# Patient Record
Sex: Female | Born: 1975 | Race: Asian | Hispanic: No | Marital: Married | Smoking: Never smoker
Health system: Southern US, Community
[De-identification: ages and names within clinical notes are randomized; demographics above are authoritative.]

## PROBLEM LIST (undated history)

## (undated) DIAGNOSIS — R519 Headache, unspecified: Secondary | ICD-10-CM

## (undated) DIAGNOSIS — R51 Headache: Secondary | ICD-10-CM

## (undated) HISTORY — DX: Headache: R51

## (undated) HISTORY — DX: Headache, unspecified: R51.9

## (undated) HISTORY — PX: TYMPANOPLASTY: SHX33

---

## 2009-03-20 ENCOUNTER — Emergency Department (HOSPITAL_COMMUNITY): Admission: EM | Admit: 2009-03-20 | Discharge: 2009-03-20 | Payer: Self-pay | Admitting: Emergency Medicine

## 2011-03-20 IMAGING — CR DG FINGER RING 2+V*L*
1 series · 1 of 1 positions shown · non-contrast
Comparison: None

CLINICAL DATA: Crush injury.  Pain.

LEFT RING FINGER 2+V

[view not recorded]
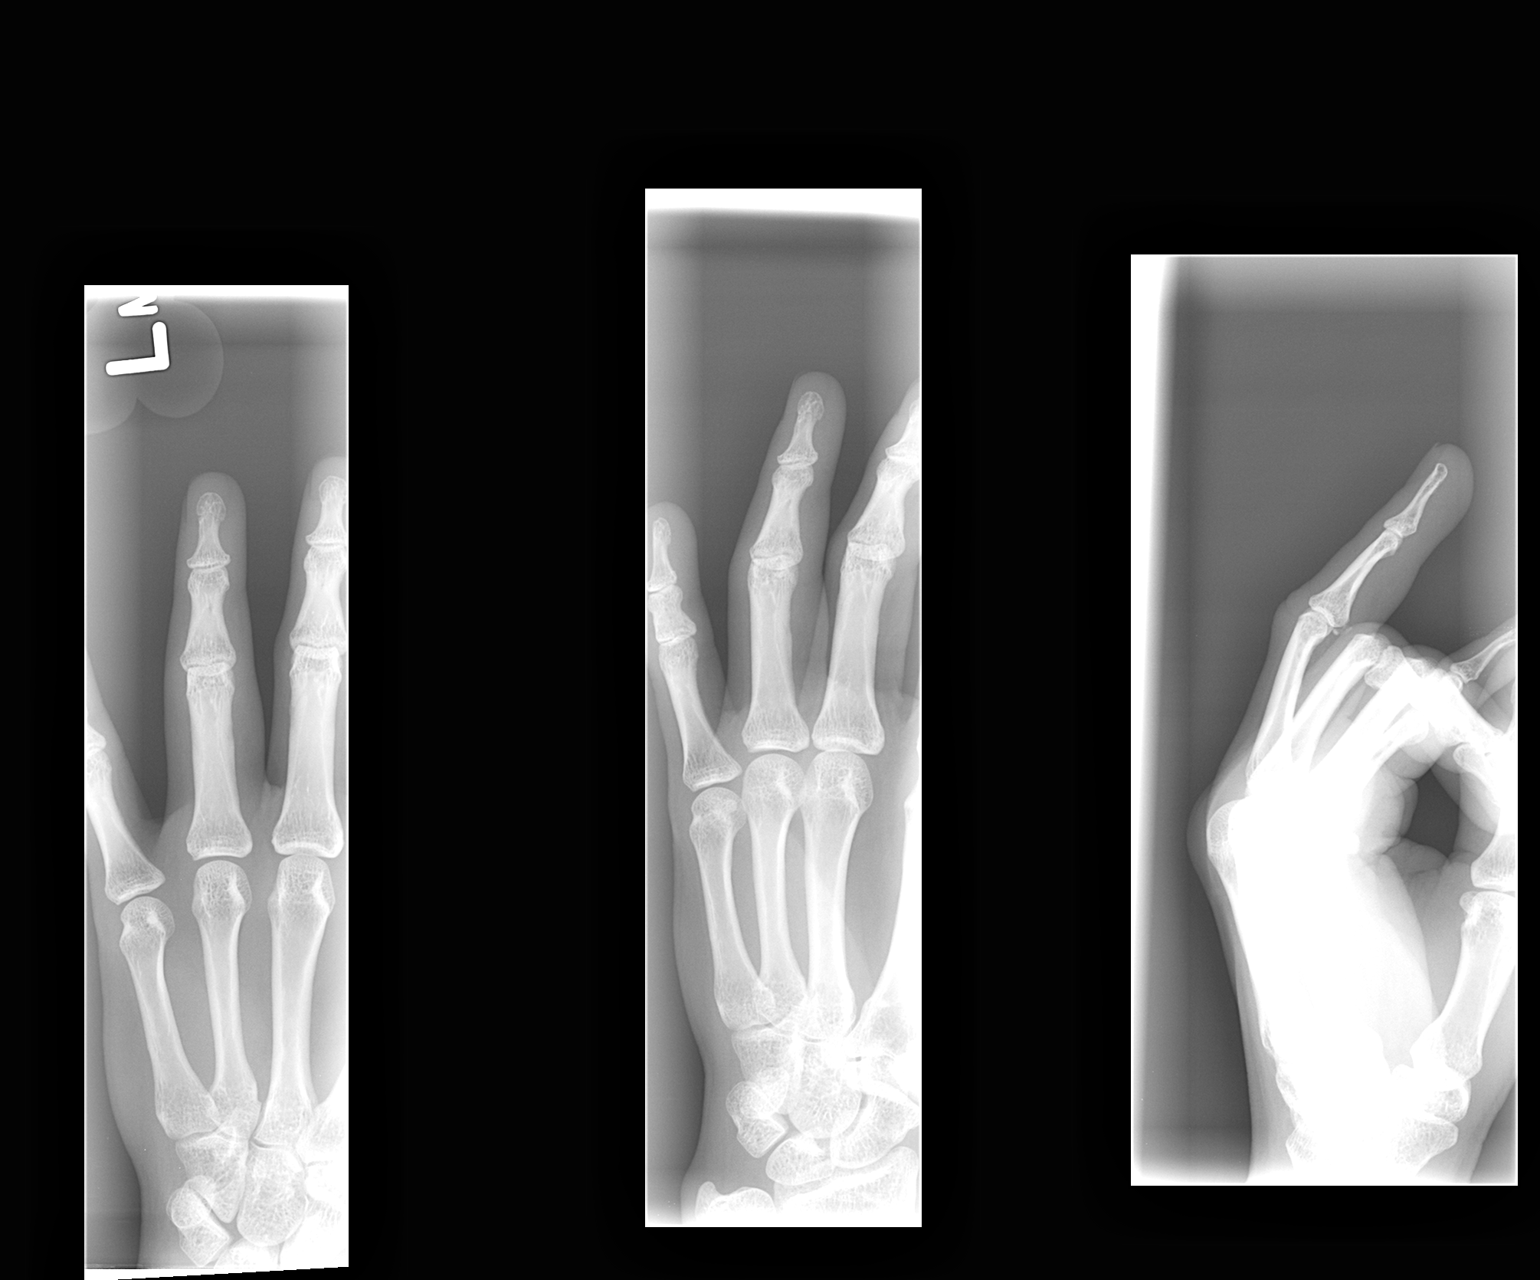

[1 of 1 positions shown; findings below may reference images not displayed]

FINDINGS: There is a fracture of the proximal volar corner of the
proximal phalanx of the ring finger suggesting a avulsion of the
flexor tendon.  Articular surfaces appear intact.  No other
regional abnormality.
IMPRESSION: A avulsion fracture of the proximal volar corner of the middle
phalanx of the ring finger.

## 2012-10-20 DIAGNOSIS — R109 Unspecified abdominal pain: Secondary | ICD-10-CM | POA: Insufficient documentation

## 2012-10-20 DIAGNOSIS — N83209 Unspecified ovarian cyst, unspecified side: Secondary | ICD-10-CM | POA: Insufficient documentation

## 2013-06-10 DIAGNOSIS — N760 Acute vaginitis: Secondary | ICD-10-CM | POA: Insufficient documentation

## 2013-08-31 DIAGNOSIS — J0100 Acute maxillary sinusitis, unspecified: Secondary | ICD-10-CM | POA: Insufficient documentation

## 2013-10-23 DIAGNOSIS — T50905A Adverse effect of unspecified drugs, medicaments and biological substances, initial encounter: Secondary | ICD-10-CM | POA: Insufficient documentation

## 2013-12-15 ENCOUNTER — Ambulatory Visit: Admitting: Neurology

## 2013-12-18 ENCOUNTER — Encounter (INDEPENDENT_AMBULATORY_CARE_PROVIDER_SITE_OTHER): Payer: Self-pay

## 2013-12-18 ENCOUNTER — Encounter: Payer: Self-pay | Admitting: Neurology

## 2013-12-18 ENCOUNTER — Ambulatory Visit (INDEPENDENT_AMBULATORY_CARE_PROVIDER_SITE_OTHER): Admitting: Neurology

## 2013-12-18 VITALS — BP 126/86 | HR 83 | Ht 59.0 in | Wt 114.0 lb

## 2013-12-18 DIAGNOSIS — R519 Headache, unspecified: Secondary | ICD-10-CM | POA: Insufficient documentation

## 2013-12-18 DIAGNOSIS — G43909 Migraine, unspecified, not intractable, without status migrainosus: Secondary | ICD-10-CM | POA: Insufficient documentation

## 2013-12-18 DIAGNOSIS — G43009 Migraine without aura, not intractable, without status migrainosus: Secondary | ICD-10-CM

## 2013-12-18 DIAGNOSIS — R51 Headache: Secondary | ICD-10-CM

## 2013-12-18 MED ORDER — RIZATRIPTAN BENZOATE 10 MG PO TABS: 10.0000 mg | ORAL_TABLET | ORAL | Status: DC | PRN

## 2013-12-18 MED ORDER — NORTRIPTYLINE HCL 10 MG PO CAPS: ORAL_CAPSULE | ORAL | Status: DC

## 2013-12-18 NOTE — Progress Notes (Signed)
PATIENT: Carolyn [name] DOB: 1976-06-05  HISTORICAL  Carolyn [name] is a 38 years old right-handed Asian female, referred by her primary care physician for evaluation of frequent headaches,  She reported a history of migraine headaches since high school, getting worse over the past one year, to almost daily basis, her headache usually triggered by hungry, and Eye-Strain, her typical migraine it is lateralized severe pounding headaches with associated light, noise sensitivity, lasting couple hours, sometimes of days,  Over the past one year, she has self medicated herself with almost daily ibuprofen, naproxen, has developed a rash,  She presented to the emergency room 2 weeks ago following one episode of severe headaches, was given prescription of Maxalt, last night, she tried it for the first time, which has been very helpful, there was no significant side effect  REVIEW OF SYSTEMS: Full 14 system review of systems performed and notable only for palpation, eye pain, feeling hot, flushing, joint pain, cramps, headaches, dizziness  ALLERGIES: Allergies  Allergen Reactions  . Naproxen     rash    HOME MEDICATIONS: No current outpatient prescriptions on file prior to visit.   No current facility-administered medications on file prior to visit.    PAST MEDICAL HISTORY: No past medical history on file.  PAST SURGICAL HISTORY: No past surgical history on file.  FAMILY HISTORY: No family history on file.  SOCIAL HISTORY:  History   Social History  . Marital Status: Married    Spouse Name: N/A    Number of Children: 0  . Years of Education: N/A   Occupational History  . Not on file.   Social History Main Topics  . Smoking status: Not on file  . Smokeless tobacco: Not on file  . Alcohol Use: Not on file  . Drug Use: Not on file  . Sexual Activity: Not on file   Other Topics Concern  . Not on file   Social History Narrative  . No narrative on file    PHYSICAL  EXAM   Filed Vitals:   12/18/13 1044  BP: 126/86  Pulse: 83  Height: 4\' 11"  (1.499 m)  Weight: 114 lb (51.71 kg)    Not recorded    Body mass index is 23.01 kg/(m^2).   Generalized: In no acute distress  Neck: Supple, no carotid bruits   Cardiac: Regular rate rhythm  Pulmonary: Clear to auscultation bilaterally  Musculoskeletal: No deformity  Neurological examination  Mentation: Alert oriented to time, place, history taking, and causual conversation  Cranial nerve II-XII: Pupils were equal round reactive to light. Extraocular movements were full.  Visual field were full on confrontational test. Bilateral fundi were sharp.  Facial sensation and strength were normal. Hearing was intact to finger rubbing bilaterally. Uvula tongue midline.  Head turning and shoulder shrug and were normal and symmetric.Tongue protrusion into cheek strength was normal.  Motor: Normal tone, bulk and strength.  Sensory: Intact to fine touch, pinprick, preserved vibratory sensation, and proprioception at toes.  Coordination: Normal finger to nose, heel-to-shin bilaterally there was no truncal ataxia  Gait: Rising up from seated position without assistance, normal stance, without trunk ataxia, moderate stride, good arm swing, smooth turning, able to perform tiptoe, and heel walking without difficulty.   Romberg signs: Negative  Deep tendon reflexes: Brachioradialis 2/2, biceps 2/2, triceps 2/2, patellar 2/2, Achilles 2/2, plantar responses were flexor bilaterally.   DIAGNOSTIC DATA (LABS, IMAGING, TESTING) - I reviewed patient records, labs, notes, testing and imaging myself where available.  ASSESSMENT AND PLAN  Carolyn [name] is a 38 y.o. female complains of  frequent migraine headaches, essentially normal neurological examinations,  1, stop preventive medications, nortriptyline, 10 mg, titrating to 20 mg every night 2. Maxalt as needed 3. Return to clinic with Eber Jones in 6  months    Levert Feinstein, M.D. Ph.D.  [facility] [address] [place], [place] [address] [phone]

## 2014-01-28 DIAGNOSIS — R319 Hematuria, unspecified: Secondary | ICD-10-CM | POA: Insufficient documentation

## 2014-01-28 DIAGNOSIS — K319 Disease of stomach and duodenum, unspecified: Secondary | ICD-10-CM | POA: Insufficient documentation

## 2014-02-20 DIAGNOSIS — Z3002 Counseling and instruction in natural family planning to avoid pregnancy: Secondary | ICD-10-CM | POA: Insufficient documentation

## 2014-04-26 DIAGNOSIS — F529 Unspecified sexual dysfunction not due to a substance or known physiological condition: Secondary | ICD-10-CM | POA: Insufficient documentation

## 2014-05-03 ENCOUNTER — Telehealth: Payer: Self-pay | Admitting: Neurology

## 2014-05-03 NOTE — Telephone Encounter (Signed)
Left message for patient regarding rescheduling 06/21/14 appointment per Carolyn's schedule, rescheduled to next available with DR. Terrace ArabiaYan on same day.

## 2014-06-21 ENCOUNTER — Ambulatory Visit: Admitting: Nurse Practitioner

## 2014-06-21 ENCOUNTER — Encounter: Payer: Self-pay | Admitting: Neurology

## 2014-06-21 ENCOUNTER — Ambulatory Visit (INDEPENDENT_AMBULATORY_CARE_PROVIDER_SITE_OTHER): Admitting: Neurology

## 2014-06-21 VITALS — BP 126/91 | HR 100 | Ht 59.0 in | Wt 118.0 lb

## 2014-06-21 DIAGNOSIS — G43009 Migraine without aura, not intractable, without status migrainosus: Secondary | ICD-10-CM

## 2014-06-21 MED ORDER — NORTRIPTYLINE HCL 10 MG PO CAPS: ORAL_CAPSULE | ORAL | Status: DC

## 2014-06-21 NOTE — Progress Notes (Signed)
PATIENT: Carolyn [name] DOB: 1975-12-26  HISTORICAL  Carolyn [name] is a 39 years old right-handed Asian female, referred by her primary care physician for evaluation of frequent headaches,  She reported a history of migraine headaches since high school, getting worse over the past one year, to almost daily basis, her headache usually triggered by hungry, and Eye-Strain, her typical migraine it is lateralized severe pounding headaches with associated light, noise sensitivity, lasting couple hours, sometimes of days,  Over the past one year, she has self medicated herself with almost daily ibuprofen, naproxen, has developed a rash,  She presented to the emergency room 2 weeks ago following one episode of severe headaches, was given prescription of Maxalt, last night, she tried it for the first time, which has been very helpful, there was no significant side effect  UPDATE Jan 11th 2016: Her headache has much improved on left taking nortriptyine 20mg  every night, she can sleep better, Maxalt as needed has been very helpful too, she reported 85% improvement, very happy about current medications   REVIEW OF SYSTEMS: Full 14 system review of systems performed and notable only for as above  ALLERGIES: Allergies  Allergen Reactions  . Naproxen     rash    HOME MEDICATIONS: Current Outpatient Prescriptions on File Prior to Visit  Medication Sig Dispense Refill  . nortriptyline (PAMELOR) 10 MG capsule One po qhs xone week, then 2 tabs po qhs 60 capsule 12  . rizatriptan (MAXALT) 10 MG tablet Take 1 tablet (10 mg total) by mouth as needed for migraine. May repeat in 2 hours if needed 15 tablet 12   No current facility-administered medications on file prior to visit.    PAST MEDICAL HISTORY: Past Medical History  Diagnosis Date  . HA (headache)     PAST SURGICAL HISTORY: Past Surgical History  Procedure Laterality Date  . Tympanoplasty Left     FAMILY HISTORY: No family  history on file.  SOCIAL HISTORY:  History   Social History  . Marital Status: Married    Spouse Name: N/A    Number of Children: 0  . Years of Education: N/A   Occupational History  . Not on file.   Social History Main Topics  . Smoking status: Not on file  . Smokeless tobacco: Not on file  . Alcohol Use: Not on file  . Drug Use: Not on file  . Sexual Activity: Not on file   Other Topics Concern  . Not on file   Social History Narrative  . No narrative on file    PHYSICAL EXAM   Filed Vitals:   06/21/14 1108  BP: 126/91  Pulse: 100  Height: 4\' 11"  (1.499 m)  Weight: 118 lb (53.524 kg)    Not recorded      Body mass index is 23.82 kg/(m^2).   Generalized: In no acute distress  Neck: Supple, no carotid bruits   Cardiac: Regular rate rhythm  Pulmonary: Clear to auscultation bilaterally  Musculoskeletal: No deformity  Neurological examination  Mentation: Alert oriented to time, place, history taking, and causual conversation  Cranial nerve II-XII: Pupils were equal round reactive to light. Extraocular movements were full.  Visual field were full on confrontational test. Bilateral fundi were sharp.  Facial sensation and strength were normal. Hearing was intact to finger rubbing bilaterally. Uvula tongue midline.  Head turning and shoulder shrug and were normal and symmetric.Tongue protrusion into cheek strength was normal.  Motor: Normal tone, bulk and strength.  Sensory: Intact to fine touch, pinprick, preserved vibratory sensation, and proprioception at toes.  Coordination: Normal finger to nose, heel-to-shin bilaterally there was no truncal ataxia  Gait: Rising up from seated position without assistance, normal stance, without trunk ataxia, moderate stride, good arm swing, smooth turning, able to perform tiptoe, and heel walking without difficulty.   Romberg signs: Negative  Deep tendon reflexes: Brachioradialis 2/2, biceps 2/2, triceps 2/2,  patellar 2/2, Achilles 2/2, plantar responses were flexor bilaterally.   DIAGNOSTIC DATA (LABS, IMAGING, TESTING) - I reviewed patient records, labs, notes, testing and imaging myself where available.   ASSESSMENT AND PLAN  Carolyn [name] is a 39 y.o. female complains of  frequent migraine headaches, essentially normal neurological examinations,  1, keep nortriptyline 20 mg every night 2. Maxalt as needed 3. Return to clinic with Carolyn [name] in 6 to 9 months    Levert Feinstein, M.D. Ph.D.  [facility] [address], [address] [place], [place] [address] [phone]

## 2014-07-24 DIAGNOSIS — N939 Abnormal uterine and vaginal bleeding, unspecified: Secondary | ICD-10-CM | POA: Insufficient documentation

## 2014-11-02 DIAGNOSIS — L720 Epidermal cyst: Secondary | ICD-10-CM | POA: Insufficient documentation

## 2014-12-20 ENCOUNTER — Encounter: Payer: Self-pay | Admitting: Neurology

## 2014-12-20 ENCOUNTER — Ambulatory Visit (INDEPENDENT_AMBULATORY_CARE_PROVIDER_SITE_OTHER): Admitting: Neurology

## 2014-12-20 VITALS — BP 128/86 | HR 96 | Ht 59.0 in | Wt 119.0 lb

## 2014-12-20 DIAGNOSIS — G43009 Migraine without aura, not intractable, without status migrainosus: Secondary | ICD-10-CM | POA: Diagnosis not present

## 2014-12-20 MED ORDER — SUMATRIPTAN SUCCINATE 6 MG/0.5ML ~~LOC~~ SOLN: 6.0000 mg | SUBCUTANEOUS | Status: DC | PRN

## 2014-12-20 MED ORDER — NORTRIPTYLINE HCL 10 MG PO CAPS: ORAL_CAPSULE | ORAL | Status: DC

## 2014-12-20 NOTE — Progress Notes (Signed)
Chief Complaint  Patient presents with  . Migraine    Reports getting migraines at least twice weekly.  Says migraines typically last more than a day and do not always respond to Maxalt.  She discontinued use of nortriptyline because of GI side effects.      PATIENT: Carolyn [name] DOB: [birth year]  HISTORICAL  Carolyn [name] is a 39 years old right-handed Asian female, referred by her primary care physician for evaluation of frequent headaches,  She reported a history of migraine headaches since high school, getting worse over the past one year, to almost daily basis, her headache usually triggered by hungry, and Eye-Strain, her typical migraine it is lateralized severe pounding headaches with associated light, noise sensitivity, lasting couple hours, sometimes of days,  Over the past one year, she has self medicated herself with almost daily ibuprofen, naproxen, has developed a rash,  She presented to the emergency room 2 weeks ago following one episode of severe headaches, was given prescription of Maxalt, last night, she tried it for the first time, which has been very helpful, there was no significant side effect  UPDATE Jan 11th 2016: Her headache has much improved on after nortriptyine  every night, she can sleep better, Maxalt as needed has been very helpful too, she reported 85% improvement, very happy about current medications   UPDATE December 20 2014: She reported that nortriptyline has been very helpful for her migraine headaches, but she is no longer taking it, she complains of mild GI side effect at nighttime, but she also has the habit of eating before going to sleep, she is taking Maxalt as needed, help for most of the time, but sometimes, she has headaches back-to-back, severe nausea, vomiting, repeat dose of Maxalt does not take away the headaches, she continue have almost daily headaches for 2 weeks now,  She is planning on to hysterectomy for fibroids in August 2016, is  currently receiving Depo shots   REVIEW OF SYSTEMS: Full 14 system review of systems performed and notable only for eye pain, headaches  ALLERGIES: Allergies  Allergen Reactions  . Naproxen     rash    HOME MEDICATIONS: Current Outpatient Prescriptions on File Prior to Visit  Medication Sig Dispense Refill  . rizatriptan (MAXALT) 10 MG tablet Take 1 tablet (10 mg total) by mouth as needed for migraine. May repeat in 2 hours if needed 15 tablet 12   No current facility-administered medications on file prior to visit.    PAST MEDICAL HISTORY: Past Medical History  Diagnosis Date  . HA (headache)     PAST SURGICAL HISTORY: Past Surgical History  Procedure Laterality Date  . Tympanoplasty Left     FAMILY HISTORY: No family history on file.  SOCIAL HISTORY:  History   Social History  . Marital Status: Married    Spouse Name: N/A    Number of Children: 0  . Years of Education: N/A   Occupational History  . Not on file.   Social History Main Topics  . Smoking status: Not on file  . Smokeless tobacco: Not on file  . Alcohol Use: Not on file  . Drug Use: Not on file  . Sexual Activity: Not on file   Other Topics Concern  . Not on file   Social History Narrative  . No narrative on file    PHYSICAL EXAM   Filed Vitals:   12/20/14 1113  BP: 128/86  Pulse: 96  Height:  (1.499 m)  Weight:  119 lb (53.978 kg)    Not recorded      Body mass index is 24.02 kg/(m^2).   Generalized: In no acute distress  Neck: Supple, no carotid bruits   Cardiac: Regular rate rhythm  Pulmonary: Clear to auscultation bilaterally  Musculoskeletal: No deformity  Neurological examination  Mentation: Alert oriented to time, place, history taking, and causual conversation  Cranial nerve II-XII: Pupils were equal round reactive to light. Extraocular movements were full.  Visual field were full on confrontational test. Bilateral fundi were sharp.  Facial sensation  and strength were normal. Hearing was intact to finger rubbing bilaterally. Uvula tongue midline.  Head turning and shoulder shrug and were normal and symmetric.Tongue protrusion into cheek strength was normal.  Motor: Normal tone, bulk and strength.  Sensory: Intact to fine touch, pinprick, preserved vibratory sensation, and proprioception at toes.  Coordination: Normal finger to nose, heel-to-shin bilaterally there was no truncal ataxia  Gait: Rising up from seated position without assistance, normal stance, without trunk ataxia, moderate stride, good arm swing, smooth turning, able to perform tiptoe, and heel walking without difficulty.   Romberg signs: Negative  Deep tendon reflexes: Brachioradialis 2/2, biceps 2/2, triceps 2/2, patellar 2/2, Achilles 2/2, plantar responses were flexor bilaterally.  DIAGNOSTIC DATA (LABS, IMAGING, TESTING) - I reviewed patient records, labs, notes, testing and imaging myself where available.   ASSESSMENT AND PLAN  Carolyn [name] is a [age] y.o. female   Worsening Chronic migraine, restart preventive medications nortriptyline titrating to 20 mg every day  Maxalt as needed, Imitrex subcutaneous injection as rescue therapy Return to clinic in 2-3 months with nurse practitioner  Levert FeinsteinYijun Audri Kozub, M.D. Ph.D.  [facility] [address], [address] [place], [place] [id number] [phone]

## 2014-12-20 NOTE — Patient Instructions (Signed)
Magnesium oxide 400 mg twice a day Riboflavin  100 mg twice a day 

## 2014-12-31 DIAGNOSIS — Z3042 Encounter for surveillance of injectable contraceptive: Secondary | ICD-10-CM | POA: Insufficient documentation

## 2015-01-10 ENCOUNTER — Telehealth: Payer: Self-pay | Admitting: Neurology

## 2015-01-10 HISTORY — PX: LAPAROSCOPIC TOTAL HYSTERECTOMY: SUR800

## 2015-01-10 NOTE — Telephone Encounter (Signed)
Spoke to Carolyn [name] at Carolyn [name] [phone] - she is going to allow Carolyn [name] to return the multi-dose vial and she will exchange it for a sumatriptan kit with the same directions.  Spoke to Carolyn [name] - once she picks up the kits (which includes the needle), a nurse at her place of employment will teach her how to inject the medication.  I told her to call back with any questions.

## 2015-01-10 NOTE — Telephone Encounter (Signed)
Patient is calling in regard to Rx sumatriptan 6 mg/0.5 ml solan injection. She went to get this at the pharmacy and they told her she needs to know the needle size. She stated she has never used a needle before and would like to know if it is possible to get a needle similar to what diabetics use, short and easier to inject.  Please call her to discuss.  Thanks!

## 2015-01-11 ENCOUNTER — Telehealth: Payer: Self-pay | Admitting: Neurology

## 2015-01-11 ENCOUNTER — Other Ambulatory Visit: Payer: Self-pay | Admitting: Neurology

## 2015-01-11 ENCOUNTER — Other Ambulatory Visit: Payer: Self-pay | Admitting: *Deleted

## 2015-01-11 NOTE — Telephone Encounter (Signed)
Spoke to pharmacy - she was able to pick up the injection kit today.

## 2015-01-11 NOTE — Telephone Encounter (Signed)
Please call [facility] regarding imitrex

## 2015-02-28 ENCOUNTER — Encounter: Payer: Self-pay | Admitting: Nurse Practitioner

## 2015-02-28 ENCOUNTER — Ambulatory Visit (INDEPENDENT_AMBULATORY_CARE_PROVIDER_SITE_OTHER): Admitting: Nurse Practitioner

## 2015-02-28 VITALS — BP 126/94 | HR 100 | Wt 118.8 lb

## 2015-02-28 DIAGNOSIS — G43009 Migraine without aura, not intractable, without status migrainosus: Secondary | ICD-10-CM | POA: Diagnosis not present

## 2015-02-28 DIAGNOSIS — R519 Headache, unspecified: Secondary | ICD-10-CM

## 2015-02-28 DIAGNOSIS — R51 Headache: Secondary | ICD-10-CM | POA: Diagnosis not present

## 2015-02-28 NOTE — Patient Instructions (Signed)
May stop nortriptyline due to rash, currently taking 10 mg at night Continue Maxalt and Imitrex acutely Call for increase in headaches Follow-up in 6 months

## 2015-02-28 NOTE — Progress Notes (Signed)
[facility]  PATIENT: Carolyn [name] DOB: Apr 24, 1976   REASON FOR VISIT: Follow-up for migraine/headaches HISTORY FROM: Patient    HISTORY OF PRESENT ILLNESS:Carolyn [name] is a 39 years old right-handed Asian female, referred by her primary care physician for evaluation of frequent headaches, She reported a history of migraine headaches since high school, getting worse over the past one year, to almost daily basis, her headache usually triggered by hungry, and Eye-Strain, her typical migraine it is lateralized severe pounding headaches with associated light, noise sensitivity, lasting couple hours, sometimes of days, Over the past one year, she has self medicated herself with almost daily ibuprofen, naproxen, has developed a rash, She presented to the emergency room 2 weeks ago following one episode of severe headaches, was given prescription of Maxalt, last night, she tried it for the first time, which has been very helpful, there was no significant side effect UPDATE December 20 2014: She reported that nortriptyline has been very helpful for her migraine headaches, but she is no longer taking it, she complains of mild GI side effect at nighttime, but she also has the habit of eating before going to sleep, she is taking Maxalt as needed, help for most of the time, but sometimes, she has headaches back-to-back, severe nausea, vomiting, repeat dose of Maxalt does not take away the headaches, she continue have almost daily headaches for 2 weeks now, She is planning on to hysterectomy for fibroids in August 2016, is currently receiving Depo shots   UPDATE 02/28/2015 Ms. Swailes, 39 year old female returns for follow-up. She had a hysterectomy for fibroids in August she is no longer taking her Depo shots. Her headaches are much improved. She is still taking nortriptyline 10 mg at night however she says she has developed a rash from the medication. She takes Maxalt acutely. She has  not had to take the Imitrex injections. She is not aware of any food triggers. She returns for reevaluation   REVIEW OF SYSTEMS: Full 14 system review of systems performed and notable only for those listed, all others are neg:  Constitutional: neg  Cardiovascular: neg Ear/Nose/Throat: neg  Skin: Itching Eyes: Allergies Respiratory: neg Gastroitestinal: neg  Hematology/Lymphatic: neg  Endocrine: neg Musculoskeletal:neg Allergy/Immunology: neg Neurological: neg Psychiatric: neg Sleep : neg   ALLERGIES: Allergies  Allergen Reactions  . Naproxen     rash    HOME MEDICATIONS: Outpatient Prescriptions Prior to Visit  Medication Sig Dispense Refill  . rizatriptan (MAXALT) 10 MG tablet TAKE 1 TABLET BY MOUTH AS NEEDED FOR MIGRAINES; MAY REPEAT IN 2 HOURS IF NEEDED 15 tablet 3  . SUMAtriptan (IMITREX) 6 MG/0.5ML SOLN injection Inject 0.5 mLs (6 mg total) into the skin every 2 (two) hours as needed for migraine or headache. May repeat in 2 hours if headache persists or recurs. 5 mL 6  . nortriptyline (PAMELOR) 10 MG capsule 2 tabs po qhs 60 capsule 11   No facility-administered medications prior to visit.    PAST MEDICAL HISTORY: Past Medical History  Diagnosis Date  . HA (headache)     PAST SURGICAL HISTORY: Past Surgical History  Procedure Laterality Date  . Tympanoplasty Left   . Laparoscopic total hysterectomy  8/ 2016    FAMILY HISTORY: History reviewed. No pertinent family history.  SOCIAL HISTORY: Social History   Social History  . Marital Status: Married    Spouse Name: Ramon Dredge  . Number of Children: o  . Years of Education: college   Occupational History  .  [employer]   Social History Main Topics  . Smoking status: Never Smoker   . Smokeless tobacco: Never Used  . Alcohol Use: No  . Drug Use: No  . Sexual Activity: Not on file   Other Topics Concern  . Not on file   Social History Narrative   Patient lives at home with her  husband.Ramon Dredge). Patient works for [employer] full time.   Education college.   Right handed.   Caffeine None.           PHYSICAL EXAM  Filed Vitals:   02/28/15 1311  BP: 126/94  Pulse: 100  Weight: 118 lb 12.8 oz (53.887 kg)   Body mass index is 23.98 kg/(m^2). Generalized: In no acute distress Neck: Supple, no carotid bruits  Cardiac: Regular rate rhythm Musculoskeletal: No deformity  Neurological examination  Mentation: Alert oriented to time, place, history taking, and causual conversation Cranial nerve II-XII: Pupils were equal round reactive to light. Extraocular movements were full. Visual field were full on confrontational test. Bilateral fundi were sharp. Facial sensation and strength were normal. Hearing was intact to finger rubbing bilaterally. Uvula tongue midline. Head turning and shoulder shrug  were normal and symmetric.Tongue protrusion into cheek strength was normal. Motor: Normal tone, bulk and strength. No focal weakness Sensory: Intact to fine touch, pinprick, preserved vibratory sensation, and proprioception at toes. Coordination: Normal finger to nose, heel-to-shin bilaterally there was no truncal ataxia Gait: Rising up from seated position without assistance, normal stance, without trunk ataxia, moderate stride, good arm swing, smooth turning, able to perform tiptoe, and heel walking without difficulty.  Deep tendon reflexes: Brachioradialis 2/2, biceps 2/2, triceps 2/2, patellar 2/2, Achilles 2/2, plantar responses were flexor bilaterally.   DIAGNOSTIC DATA (LABS, IMAGING, TESTING) - ASSESSMENT AND PLAN  39 y.o. year old female  has a past medical history of HA (headache)/migraine here to follow-up.  May stop nortriptyline due to questionable rash, currently taking 10 mg at night Continue Maxalt and Imitrex acutely I spent 10 additional minutes in total face to face time with the patient more than 50% of which was spent counseling and  coordination of care, reviewing test results reviewing medications and discussing and reviewing the diagnosis of migraine and further treatment options. , Call for increase in headaches, keep a headache diary and record these Given written information on common foods which are migraine triggers , made aware she needs to one time Follow-up in 6 monthsVst time 25 min Nilda Riggs, [facility], [facility], APRN  [facility] [address] [place], [place] [address] [phone]

## 2015-02-28 NOTE — Progress Notes (Signed)
I have reviewed and agreed above plan. 

## 2015-03-21 ENCOUNTER — Ambulatory Visit: Admitting: Nurse Practitioner

## 2015-08-01 ENCOUNTER — Other Ambulatory Visit: Payer: Self-pay | Admitting: *Deleted

## 2015-08-01 MED ORDER — RIZATRIPTAN BENZOATE 10 MG PO TABS: ORAL_TABLET | ORAL | Status: DC

## 2015-08-29 ENCOUNTER — Ambulatory Visit (INDEPENDENT_AMBULATORY_CARE_PROVIDER_SITE_OTHER): Admitting: Nurse Practitioner

## 2015-08-29 ENCOUNTER — Encounter: Payer: Self-pay | Admitting: Nurse Practitioner

## 2015-08-29 VITALS — BP 131/88 | HR 87 | Ht 59.0 in | Wt 121.0 lb

## 2015-08-29 DIAGNOSIS — G43009 Migraine without aura, not intractable, without status migrainosus: Secondary | ICD-10-CM

## 2015-08-29 DIAGNOSIS — R51 Headache: Secondary | ICD-10-CM | POA: Diagnosis not present

## 2015-08-29 DIAGNOSIS — R519 Headache, unspecified: Secondary | ICD-10-CM

## 2015-08-29 NOTE — Patient Instructions (Signed)
Continue Maxalt at current dose does not need refills Sumatritan inject if needed F/U yearly

## 2015-08-29 NOTE — Progress Notes (Signed)
[facility]  PATIENT: Carolyn [name] DOB: Jun 11, 1976   REASON FOR VISIT: Follow-up for migraine headache HISTORY FROM: Patient    HISTORY OF PRESENT ILLNESS: HISTORYMerlita [name] is a 40 years old right-handed Asian female, referred by her primary care physician for evaluation of frequent headaches, She reported a history of migraine headaches since high school, getting worse over the past one year, to almost daily basis, her headache usually triggered by hungry, and Eye-Strain, her typical migraine it is lateralized severe pounding headaches with associated light, noise sensitivity, lasting couple hours, sometimes of days, Over the past one year, she has self medicated herself with almost daily ibuprofen, naproxen, has developed a rash, She presented to the emergency room 2 weeks ago following one episode of severe headaches, was given prescription of Maxalt, last night, she tried it for the first time, which has been very helpful, there was no significant side effect UPDATE December 20 2014: She reported that nortriptyline has been very helpful for her migraine headaches, but she is no longer taking it, she complains of mild GI side effect at nighttime, but she also has the habit of eating before going to sleep, she is taking Maxalt as needed, help for most of the time, but sometimes, she has headaches back-to-back, severe nausea, vomiting, repeat dose of Maxalt does not take away the headaches, she continue have almost daily headaches for 2 weeks now, She is planning on to hysterectomy for fibroids in August 2016, is currently receiving Depo shots   UPDATE 03/20/17[name], 40 year old female returns for follow-up. She had a hysterectomy for fibroids in August she is no longer taking her Depo shots. Her headaches are much improved. She has stopped her  nortriptyline 10 mg at night. She takes Maxalt acutely. She has not had to take the Imitrex injections. She has  prescription glasses . She is watching her  food triggers. She now has a rare migraine relieved with Maxalt. She returns for reevaluation    REVIEW OF SYSTEMS: Full 14 system review of systems performed and notable only for those listed, all others are neg:  Constitutional: neg  Cardiovascular: neg Ear/Nose/Throat: neg  Skin: neg Eyes: neg Respiratory: neg Gastroitestinal: neg  Hematology/Lymphatic: neg  Endocrine: neg Musculoskeletal:neg Allergy/Immunology: neg Neurological: neg Psychiatric: neg Sleep : neg   ALLERGIES: Allergies  Allergen Reactions  . Naproxen     rash    HOME MEDICATIONS: Outpatient Prescriptions Prior to Visit  Medication Sig Dispense Refill  . esomeprazole (NEXIUM) 40 MG capsule Take 40 mg by mouth as needed.     . rizatriptan (MAXALT) 10 MG tablet TAKE 1 TABLET BY MOUTH AS NEEDED FOR MIGRAINES; MAY REPEAT IN 2 HOURS IF NEEDED 15 tablet 11  . SUMAtriptan (IMITREX) 6 MG/0.5ML SOLN injection Inject 0.5 mLs (6 mg total) into the skin every 2 (two) hours as needed for migraine or headache. May repeat in 2 hours if headache persists or recurs. (Patient not taking: Reported on 08/29/2015) 5 mL 6   No facility-administered medications prior to visit.    PAST MEDICAL HISTORY: Past Medical History  Diagnosis Date  . HA (headache)     PAST SURGICAL HISTORY: Past Surgical History  Procedure Laterality Date  . Tympanoplasty Left   . Laparoscopic total hysterectomy  8/ 2016    FAMILY HISTORY: History reviewed. No pertinent family history.  SOCIAL HISTORY: Social History   Social History  . Marital Status: Married    Spouse Name: Ramon Dredge  . Number of  Children: o  . Years of Education: college   Occupational History  .      [employer]   Social History Main Topics  . Smoking status: Never Smoker   . Smokeless tobacco: Never Used  . Alcohol Use: No  . Drug Use: No  . Sexual Activity: Not on file   Other Topics Concern  . Not on file    Social History Narrative   Patient lives at home with her husband.Ramon Dredge(Edward). Patient works for [employer] full time.   Education college.   Right handed.   Caffeine None.           PHYSICAL EXAM  Filed Vitals:   08/29/15 1054  BP: 131/88  Pulse: 87  Height: 4\' 11"  (1.499 m)  Weight: 121 lb (54.885 kg)   Body mass index is 24.43 kg/(m^2). Generalized: In no acute distress Neck: Supple, no carotid bruits  Cardiac: Regular rate rhythm Musculoskeletal: No deformity  Neurological examination  Mentation: Alert oriented to time, place, history taking, and causual conversation Cranial nerve II-XII: Pupils were equal round reactive to light. Extraocular movements were full. Visual field were full on confrontational test. Bilateral fundi were sharp. Facial sensation and strength were normal. Hearing was intact to finger rubbing bilaterally. Uvula tongue midline. Head turning and shoulder shrug were normal and symmetric.Tongue protrusion into cheek strength was normal. Motor: Normal tone, bulk and strength. No focal weakness Sensory: Intact to fine touch, In the upper and lower extremities Coordination: Normal finger to nose, heel-to-shin bilaterally there was no truncal ataxia Gait: Rising up from seated position without assistance, normal stance, without trunk ataxia, moderate stride, good arm swing, smooth turning, able to perform tiptoe, and heel walking without difficulty.  Deep tendon reflexes: Brachioradialis 2/2, biceps 2/2, triceps 2/2, patellar 2/2, Achilles 2/2, plantar responses were flexor bilaterally.   DIAGNOSTIC DATA (LABS, IMAGING, TESTING)  ASSESSMENT AND PLAN  40 y.o. year old female  has a past medical history of HA (headache). here to follow-up. After having a hysterectomy last year and getting glasses her headaches are now rare and relieved with Maxalt.  PLAN Continue Maxalt at current dose does not need refills Sumatritan inject if needed F/U  yearly Nilda RiggsNancy Carolyn Martin, [facility] LLCGNP, [facility], APRN  [facility] [address], [address] [place], [place] [address] [phone]

## 2015-08-29 NOTE — Progress Notes (Signed)
I have reviewed and agreed above plan. 

## 2015-10-27 DIAGNOSIS — J40 Bronchitis, not specified as acute or chronic: Secondary | ICD-10-CM | POA: Insufficient documentation

## 2016-01-20 DIAGNOSIS — J039 Acute tonsillitis, unspecified: Secondary | ICD-10-CM | POA: Insufficient documentation

## 2016-01-30 DIAGNOSIS — Z90710 Acquired absence of both cervix and uterus: Secondary | ICD-10-CM | POA: Insufficient documentation

## 2016-08-27 ENCOUNTER — Ambulatory Visit: Admitting: Nurse Practitioner

## 2016-08-28 ENCOUNTER — Other Ambulatory Visit: Payer: Self-pay | Admitting: Neurology

## 2016-09-24 ENCOUNTER — Ambulatory Visit: Admitting: Nurse Practitioner

## 2016-09-24 ENCOUNTER — Telehealth: Payer: Self-pay | Admitting: *Deleted

## 2016-09-24 NOTE — Telephone Encounter (Signed)
LVM informing patient her follow up today was cancelled due to power outage at office. Left number and advised she call back tomorrow to reschedule.

## 2016-09-25 ENCOUNTER — Telehealth: Payer: Self-pay | Admitting: *Deleted

## 2016-09-25 NOTE — Telephone Encounter (Signed)
LVM requesting patient call back to reschedule her one year follow up with Enid Skeens, NP.

## 2016-10-29 ENCOUNTER — Ambulatory Visit: Admitting: Nurse Practitioner

## 2016-11-16 ENCOUNTER — Other Ambulatory Visit: Payer: Self-pay | Admitting: Neurology

## 2016-11-20 ENCOUNTER — Other Ambulatory Visit: Payer: Self-pay

## 2016-11-20 MED ORDER — RIZATRIPTAN BENZOATE 10 MG PO TABS: ORAL_TABLET | ORAL | 0 refills | Status: DC

## 2016-11-30 ENCOUNTER — Encounter: Payer: Self-pay | Admitting: Nurse Practitioner

## 2016-11-30 ENCOUNTER — Ambulatory Visit (INDEPENDENT_AMBULATORY_CARE_PROVIDER_SITE_OTHER): Admitting: Nurse Practitioner

## 2016-11-30 ENCOUNTER — Encounter (INDEPENDENT_AMBULATORY_CARE_PROVIDER_SITE_OTHER): Payer: Self-pay

## 2016-11-30 VITALS — BP 129/85 | HR 86 | Ht 59.0 in | Wt 120.5 lb

## 2016-11-30 DIAGNOSIS — G43009 Migraine without aura, not intractable, without status migrainosus: Secondary | ICD-10-CM | POA: Diagnosis not present

## 2016-11-30 DIAGNOSIS — R51 Headache: Secondary | ICD-10-CM | POA: Diagnosis not present

## 2016-11-30 DIAGNOSIS — R519 Headache, unspecified: Secondary | ICD-10-CM

## 2016-11-30 MED ORDER — RIZATRIPTAN BENZOATE 10 MG PO TABS: ORAL_TABLET | ORAL | 11 refills | Status: DC

## 2016-11-30 NOTE — Progress Notes (Addendum)
[facility]  PATIENT: Carolyn [name] DOB: 1976-02-14   REASON FOR VISIT: Follow-up for migraine headache HISTORY FROM: Patient    HISTORY OF PRESENT ILLNESS: HISTORYMerlita Maisie [name] is a 41 years old right-handed Asian female, referred by her primary care physician for evaluation of frequent headaches, She reported a history of migraine headaches since high school, getting worse over the past one year, to almost daily basis, her headache usually triggered by hungry, and Eye-Strain, her typical migraine it is lateralized severe pounding headaches with associated light, noise sensitivity, lasting couple hours, sometimes of days, Over the past one year, she has self medicated herself with almost daily ibuprofen, naproxen, has developed a rash, She presented to the emergency room 2 weeks ago following one episode of severe headaches, was given prescription of Maxalt, last night, she tried it for the first time, which has been very helpful, there was no significant side effect UPDATE December 20 2014: She reported that nortriptyline has been very helpful for her migraine headaches, but she is no longer taking it, she complains of mild GI side effect at nighttime, but she also has the habit of eating before going to sleep, she is taking Maxalt as needed, help for most of the time, but sometimes, she has headaches back-to-back, severe nausea, vomiting, repeat dose of Maxalt does not take away the headaches, she continue have almost daily headaches for 2 weeks now, She is planning on to hysterectomy for fibroids in August 2016, is currently receiving Depo shots   UPDATE 03/20/17[name], 41 year old female returns for follow-up. She had a hysterectomy for fibroids in August she is no longer taking her Depo shots. Her headaches are much improved. She has stopped her  nortriptyline 10 mg at night. She takes Maxalt acutely. She has not had to take the Imitrex injections. She has  prescription glasses . She is watching her  food triggers. She now has a rare migraine relieved with Maxalt. She returns for reevaluation  UPDATE 06/22/2018CM [name], 41 year old female returns for follow-up with history of migraine headaches. She was on a preventative nortriptyline. Medication. She does not have migraines to warrant a preventative at this time. She remains on Maxalt but complains of drowsiness. She does not want to take sumatriptan injection. Heat  is a big migraine trigger for her and she about tries to avoid her food triggers as well. She returns for reevaluation  REVIEW OF SYSTEMS: Full 14 system review of systems performed and notable only for those listed, all others are neg:  Constitutional: neg  Cardiovascular: neg Ear/Nose/Throat: neg  Skin: neg Eyes: neg Respiratory: neg Gastroitestinal: neg  Hematology/Lymphatic: neg  Endocrine: neg Musculoskeletal:neg Allergy/Immunology: neg Neurological: neg Psychiatric: neg Sleep : neg   ALLERGIES: Allergies  Allergen Reactions  . Naproxen     rash  . Relpax  [Eletriptan Hydrobromide]     HOME MEDICATIONS: Outpatient Medications Prior to Visit  Medication Sig Dispense Refill  . esomeprazole (NEXIUM) 40 MG capsule Take 40 mg by mouth as needed.     Marland Kitchen. LOTEMAX 0.5 % ophthalmic suspension   0  . rizatriptan (MAXALT) 10 MG tablet TAKE 1 TABLET BY MOUTH AS NEEDED FOR MIGRAINES; MAY REPEAT IN 2 HOURS IF NEEDED 15 tablet 0  . SUMAtriptan (IMITREX) 6 MG/0.5ML SOLN injection Inject 0.5 mLs (6 mg total) into the skin every 2 (two) hours as needed for migraine or headache. May repeat in 2 hours if headache persists or recurs. (Patient not taking: Reported on 11/30/2016)  5 mL 6   No facility-administered medications prior to visit.     PAST MEDICAL HISTORY: Past Medical History:  Diagnosis Date  . HA (headache)     PAST SURGICAL HISTORY: Past Surgical History:  Procedure Laterality Date  . LAPAROSCOPIC TOTAL  HYSTERECTOMY  8/ 2016  . TYMPANOPLASTY Left     FAMILY HISTORY: History reviewed. No pertinent family history.  SOCIAL HISTORY: Social History   Social History  . Marital status: Married    Spouse name: Ramon Dredge  . Number of children: o  . Years of education: college   Occupational History  .      [employer]   Social History Main Topics  . Smoking status: Never Smoker  . Smokeless tobacco: Never Used  . Alcohol use No  . Drug use: No  . Sexual activity: Not on file   Other Topics Concern  . Not on file   Social History Narrative   Patient lives at home with her husband.Ramon Dredge). Patient works for [employer] full time.   Education college.   Right handed.   Caffeine None.           PHYSICAL EXAM  Vitals:   11/30/16 1026  BP: 129/85  Pulse: 86  Weight: 120 lb 8 oz (54.7 kg)  Height: 4\' 11"  (1.499 m)   Body mass index is 24.34 kg/m. Generalized: In no acute distress Neck: Supple,  Musculoskeletal: No deformity  Neurological examination  Mentation: Alert oriented to time, place, history taking, and causual conversation Cranial nerve II-XII: Pupils were equal round reactive to light. Extraocular movements were full. Visual field were full on confrontational test. Bilateral fundi were sharp. Facial sensation and strength were normal. Hearing was intact to finger rubbing bilaterally. Uvula tongue midline. Head turning and shoulder shrug were normal and symmetric.Tongue protrusion into cheek strength was normal. Motor: Normal tone, bulk and strength. No focal weakness Sensory: Intact to fine touch, In the upper and lower extremities Coordination: Normal finger to nose, heel-to-shin bilaterally Gait: Rising up from seated position without assistance, normal stance, without trunk ataxia, moderate stride, good arm swing, smooth turning, able to perform tiptoe, and heel walking without difficulty.  Deep tendon reflexes: Symmetric upper and lower plantar  responses were flexor bilaterally.   DIAGNOSTIC DATA (LABS, IMAGING, TESTING)  ASSESSMENT AND PLAN  41 y.o. year old female  has a past medical history of HA (headache). here to follow-up. After having a hysterectomy last year and getting glasses her headaches are now rare and relieved with Maxalt. The Maxalt sometimes causes her to be too drowsy.The patient is a current patient of Dr. Terrace Arabia who is out of the office today . This note is sent to the work in doctor.     PLAN Continue Maxalt  Try 1/2 tab prn will refill If headaches increase keep a record on migraine tracker Stay well hydrated and eat F/U yearly Nilda Riggs, [facility], [facility], APRN  [facility] [address] [place], [place] [address] [phone]  I reviewed the above note and documentation by the Nurse Practitioner and agree with the history, physical exam, assessment and plan as outlined above. I was immediately available for face-to-face consultation. Huston Foley, MD, PhD [facility])

## 2016-11-30 NOTE — Patient Instructions (Signed)
Continue Maxalt at current dose does not need refills If headaches increase keep a record on migraine tracker F/U yearly

## 2017-01-15 ENCOUNTER — Telehealth: Payer: Self-pay | Admitting: *Deleted

## 2017-01-15 NOTE — Telephone Encounter (Signed)
Called and spoke with Eddie from [facility]. Advised we received PA request d/t quantity limit. Patient given 15 tablets on 11/30/16 with 11 refills.   He states patient can receive 9 tablets every 15 days. Pt filled rx on 10/18/16 (6 tablets), 11/20/16 (9 tablets), 12/23/16 (9 tablets)  I called [facility] at [phone] and spoke with Delisha. She states her insurance plan allows for 12 tablets per month. She is due for refill today.   I called [facility] back. Spoke with pharmacist Asher Muir(Jamie). Gave VO per Dr Terrace ArabiaYan to change rx to qty 12 tablet per month, 11 refills. Same directions. She cx prevoius rx sent on 11/30/16.

## 2017-09-19 DIAGNOSIS — R42 Dizziness and giddiness: Secondary | ICD-10-CM | POA: Insufficient documentation

## 2017-09-19 DIAGNOSIS — R110 Nausea: Secondary | ICD-10-CM | POA: Insufficient documentation

## 2017-12-01 NOTE — Progress Notes (Signed)
[facility]  PATIENT: Yvonne Stopher DOB: 1976/04/29   REASON FOR VISIT: Follow-up for migraine headache HISTORY FROM: Patient    HISTORY OF PRESENT ILLNESS: HISTORYMerlita Vandivier is a 42 years old right-handed Asian female, referred by her primary care physician for evaluation of frequent headaches, She reported a history of migraine headaches since high school, getting worse over the past one year, to almost daily basis, her headache usually triggered by hungry, and Eye-Strain, her typical migraine it is lateralized severe pounding headaches with associated light, noise sensitivity, lasting couple hours, sometimes of days, Over the past one year, she has self medicated herself with almost daily ibuprofen, naproxen, has developed a rash, She presented to the emergency room 2 weeks ago following one episode of severe headaches, was given prescription of Maxalt, last night, she tried it for the first time, which has been very helpful, there was no significant side effect UPDATE December 20 2014: She reported that nortriptyline has been very helpful for her migraine headaches, but she is no longer taking it, she complains of mild GI side effect at nighttime, but she also has the habit of eating before going to sleep, she is taking Maxalt as needed, help for most of the time, but sometimes, she has headaches back-to-back, severe nausea, vomiting, repeat dose of Maxalt does not take away the headaches, she continue have almost daily headaches for 2 weeks now, She is planning on to hysterectomy for fibroids in August 2016, is currently receiving Depo shots   UPDATE 03/20/17Ms. Kapusta, 42 year old female returns for follow-up. She had a hysterectomy for fibroids in August she is no longer taking her Depo shots. Her headaches are much improved. She has stopped her  nortriptyline 10 mg at night. She takes Maxalt acutely. She has not had to take the Imitrex injections. She has  prescription glasses . She is watching her  food triggers. She now has a rare migraine relieved with Maxalt. She returns for reevaluation  UPDATE 06/22/2018CM Ms. Bourget, 42 year old female returns for follow-up with history of migraine headaches. She was on a preventative nortriptyline. Medication. She does not have migraines to warrant a preventative at this time. She remains on Maxalt but complains of drowsiness. She does not want to take sumatriptan injection. Heat  is a big migraine trigger for her and she about tries to avoid her food triggers as well. She returns for reevaluation UPDATE 6/24/2019CM Ms. Bessler, 42 year old female returns for follow-up with history of migraine headaches.  She has 1-2 headaches per month which does not warrant a preventive medication.  She has been on nortriptyline in the past.  Heat is a trigger along with certain foods.  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  She returns for reevaluation  REVIEW OF SYSTEMS: Full 14 system review of systems performed and notable only for those listed, all others are neg:  Constitutional: neg  Cardiovascular: neg Ear/Nose/Throat: neg  Skin: neg Eyes: neg Respiratory: neg Gastroitestinal: neg  Hematology/Lymphatic: neg  Endocrine: neg Musculoskeletal:neg Allergy/Immunology: neg Neurological: Migraine headaches in good control Psychiatric: neg Sleep : neg   ALLERGIES: Allergies  Allergen Reactions  . Naproxen     rash  . Relpax  [Eletriptan Hydrobromide]     HOME MEDICATIONS: Outpatient Medications Prior to Visit  Medication Sig Dispense Refill  . esomeprazole (NEXIUM) 40 MG capsule Take 40 mg by mouth as needed.     Marland Kitchen LOTEMAX 0.5 % ophthalmic suspension   0  . rizatriptan (  MAXALT) 10 MG tablet TAKE 1 TABLET BY MOUTH AS NEEDED FOR MIGRAINES; MAY REPEAT IN 2 HOURS IF NEEDED 15 tablet 11   No facility-administered medications prior to visit.     PAST MEDICAL HISTORY: Past Medical  History:  Diagnosis Date  . HA (headache)     PAST SURGICAL HISTORY: Past Surgical History:  Procedure Laterality Date  . LAPAROSCOPIC TOTAL HYSTERECTOMY  8/ 2016  . TYMPANOPLASTY Left     FAMILY HISTORY: History reviewed. No pertinent family history.  SOCIAL HISTORY: Social History   Socioeconomic History  . Marital status: Married    Spouse name: Ramon Dredge  . Number of children: o  . Years of education: college  . Highest education level: Not on file  Occupational History    Comment: [employer]  Social Needs  . Financial resource strain: Not on file  . Food insecurity:    Worry: Not on file    Inability: Not on file  . Transportation needs:    Medical: Not on file    Non-medical: Not on file  Tobacco Use  . Smoking status: Never Smoker  . Smokeless tobacco: Never Used  Substance and Sexual Activity  . Alcohol use: No  . Drug use: No  . Sexual activity: Not on file  Lifestyle  . Physical activity:    Days per week: Not on file    Minutes per session: Not on file  . Stress: Not on file  Relationships  . Social connections:    Talks on phone: Not on file    Gets together: Not on file    Attends religious service: Not on file    Active member of club or organization: Not on file    Attends meetings of clubs or organizations: Not on file    Relationship status: Not on file  . Intimate partner violence:    Fear of current or ex partner: Not on file    Emotionally abused: Not on file    Physically abused: Not on file    Forced sexual activity: Not on file  Other Topics Concern  . Not on file  Social History Narrative   Patient lives at home with her husband.Ramon Dredge). Patient works for [employer] full time.   Education college.   Right handed.   Caffeine None.           PHYSICAL EXAM  Vitals:   12/02/17 0953  BP: 114/74  Pulse: 89  Weight: 115 lb 9.6 oz (52.4 kg)  Height: 4\' 11"  (1.499 m)   Body mass index is 23.35 kg/m. Generalized: In no  acute distress Neck: Supple,  Musculoskeletal: No deformity  Neurological examination  Mentation: Alert oriented to time, place, history taking, and causual conversation Cranial nerve II-XII: Pupils were equal round reactive to light. Extraocular movements were full. Visual field were full on confrontational test. Bilateral fundi were sharp. Facial sensation and strength were normal. Hearing was intact to finger rubbing bilaterally. Uvula tongue midline. Head turning and shoulder shrug were normal and symmetric.Tongue protrusion into cheek strength was normal. Motor: Normal tone, bulk and strength. No focal weakness Sensory: Intact to fine touch, In the upper and lower extremities Coordination: Normal finger to nose, heel-to-shin bilaterally Gait: Rising up from seated position without assistance, normal stance, without trunk ataxia, moderate stride, good arm swing, smooth turning, able to perform tiptoe, and heel walking without difficulty.  Deep tendon reflexes: Symmetric upper and lower plantar responses were flexor bilaterally.   DIAGNOSTIC DATA (LABS,  IMAGING, TESTING)  ASSESSMENT AND PLAN  42 y.o. year old female  has a past medical history of HA (headache). here to follow-up. After having a hysterectomy 2  years and getting glasses her headaches are now rare and relieved with Maxalt.  She continues to have 1-2 headaches per month.   PLAN Continue Maxalt  10mg  tab prn will refill If headaches increase keep a record on migraine tracker, migraine buddy Stay well hydrated and avoid skipping meals Avoid known food triggers F/U yearly Nilda RiggsNancy Carolyn Wilna Pennie, [facility]GNP, [facility], APRN  [facility] [address], [address] [place], [place] [address] [phone]

## 2017-12-02 ENCOUNTER — Encounter: Payer: Self-pay | Admitting: Nurse Practitioner

## 2017-12-02 ENCOUNTER — Ambulatory Visit (INDEPENDENT_AMBULATORY_CARE_PROVIDER_SITE_OTHER): Admitting: Nurse Practitioner

## 2017-12-02 VITALS — BP 114/74 | HR 89 | Ht 59.0 in | Wt 115.6 lb

## 2017-12-02 DIAGNOSIS — G43009 Migraine without aura, not intractable, without status migrainosus: Secondary | ICD-10-CM | POA: Diagnosis not present

## 2017-12-02 MED ORDER — RIZATRIPTAN BENZOATE 10 MG PO TABS: ORAL_TABLET | ORAL | 11 refills | Status: DC

## 2017-12-02 NOTE — Patient Instructions (Signed)
Continue Maxalt  10mg  tab prn will refill If headaches increase keep a record on migraine tracker, migraine buddy Stay well hydrated and eat Avoid food triggers F/U yearly

## 2017-12-09 NOTE — Progress Notes (Signed)
I have reviewed and agreed above plan. 

## 2018-02-21 DIAGNOSIS — R232 Flushing: Secondary | ICD-10-CM | POA: Insufficient documentation

## 2018-08-11 ENCOUNTER — Encounter: Payer: Self-pay | Admitting: Nurse Practitioner

## 2018-12-03 ENCOUNTER — Telehealth: Payer: Self-pay | Admitting: *Deleted

## 2018-12-03 NOTE — Telephone Encounter (Signed)
Pt called in and stated something is wrong with her phone she is unable to get the mychart to work she wants to know if she can come in office for her visit

## 2018-12-03 NOTE — Telephone Encounter (Signed)
I spoke to pt and she will come in for office visit, gave her instructions relating to covid 19 process.  She verbalized understanding.

## 2018-12-03 NOTE — Telephone Encounter (Signed)
Due to current COVID 19 pandemic, our office is severely reducing in office visits until further notice, in order to minimize the risk to our patients and healthcare providers.  Pt understands that although there may be some limitations with this type of visit, we will take all precautions to reduce any security or privacy concerns.  Pt understands that this will be treated like an in office visit and we will file with pt's insurance, and there may be a patient responsible charge related to this service.. Consented to Brandon.  Email sent [email or web address]

## 2018-12-04 ENCOUNTER — Ambulatory Visit (INDEPENDENT_AMBULATORY_CARE_PROVIDER_SITE_OTHER): Admitting: Neurology

## 2018-12-04 ENCOUNTER — Other Ambulatory Visit: Payer: Self-pay

## 2018-12-04 ENCOUNTER — Encounter: Payer: Self-pay | Admitting: Neurology

## 2018-12-04 ENCOUNTER — Ambulatory Visit: Admitting: Nurse Practitioner

## 2018-12-04 VITALS — BP 120/80 | HR 87 | Temp 97.8°F | Ht 59.0 in | Wt 121.6 lb

## 2018-12-04 DIAGNOSIS — G43009 Migraine without aura, not intractable, without status migrainosus: Secondary | ICD-10-CM | POA: Diagnosis not present

## 2018-12-04 MED ORDER — VENLAFAXINE HCL ER 37.5 MG PO CP24: ORAL_CAPSULE | ORAL | 3 refills | Status: DC

## 2018-12-04 MED ORDER — RIZATRIPTAN BENZOATE 10 MG PO TABS: ORAL_TABLET | ORAL | 11 refills | Status: DC

## 2018-12-04 MED ORDER — ONDANSETRON 4 MG PO TBDP: 4.0000 mg | ORAL_TABLET | Freq: Three times a day (TID) | ORAL | 1 refills | Status: DC | PRN

## 2018-12-04 NOTE — Progress Notes (Signed)
I have reviewed and agreed above plan. 

## 2018-12-04 NOTE — Progress Notes (Signed)
PATIENT: Carolyn [name] DOB: 1976-04-28  REASON FOR VISIT: follow up HISTORY FROM: patient  HISTORY OF PRESENT ILLNESS: Today 12/04/18  HISTORY  HISTORYMerlita Maisie [name] is a 43 years old right-handed Asian [name], referred by her primary care physician for evaluation of frequent headaches, She reported a history of migraine headaches since high school, getting worse over the past one year, to almost daily basis, her headache usually triggered by hungry, and Eye-Strain, her typical migraine it is lateralized severe pounding headaches with associated light, noise sensitivity, lasting couple hours, sometimes of days, Over the past one year, she has self medicated herself with almost daily ibuprofen, naproxen, has developed a rash, She presented to the emergency room 2 weeks ago following one episode of severe headaches, was given prescription of Maxalt, last night, she tried it for the first time, which has been very helpful, there was no significant side effect UPDATE December 20 2014: She reported that nortriptyline has been very helpful for her migraine headaches, but she is no longer taking it, she complains of mild GI side effect at nighttime, but she also has the habit of eating before going to sleep, she is taking Maxalt as needed, help for most of the time, but sometimes, she has headaches back-to-back, severe nausea, vomiting, repeat dose of Maxalt does not take away the headaches, she continue have almost daily headaches for 2 weeks now, She is planning on to hysterectomy for fibroids in August 2016, is currently receiving Depo shots   UPDATE 03/20/17[name], 43 year old [name] returns for follow-up. She had a hysterectomy for fibroids in August she is no longer taking her Depo shots. Her headaches are much improved. She has stopped her  nortriptyline 10 mg at night. She takes Maxalt acutely. She has not had to take the Imitrex injections. She has prescription glasses . She is watching  her  food triggers. She now has a rare migraine relieved with Maxalt. She returns for reevaluation  UPDATE 06/22/2018[name], 43 year old [name] returns for follow-up with history of migraine headaches. She was on a preventative nortriptyline. Medication. She does not have migraines to warrant a preventative at this time. She remains on Maxalt but complains of drowsiness. She does not want to take sumatriptan injection. Heat  is a big migraine trigger for her and she about tries to avoid her food triggers as well. She returns for reevaluation UPDATE 6/24/2019[name], 43 year old [name] returns for follow-up with history of migraine headaches.  She has 1-2 headaches per month which does not warrant a preventive medication.  She has been on nortriptyline in the past.  Heat is a trigger along with certain foods.  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  She returns for reevaluation  Update December 04, 2018 [name], history of migraine headaches.  She is not currently on preventative medication. For the past several months, she has had almost daily headache, attributes this to stress at work.  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  She describes her typical headache as left frontal, retro-orbital, photophobia, nausea/vomiting, sensitive to smells.  In the past she tried nortriptyline, but had side effect.  She describes this as her typical migraine.  She has been having daily Maxalt.   She had MRI of the brain in June 2019 at [facility] (found in Care Everywhere).  No acute intracranial abnormality, minimal ethmoid sinus disease   REVIEW OF SYSTEMS: Out of a complete 14 system  review of symptoms, the patient complains only of the following symptoms, and all other reviewed systems are negative.  Headache  ALLERGIES: Allergies  Allergen Reactions  . Naproxen     rash  . Relpax  [Eletriptan Hydrobromide]     HOME  MEDICATIONS: Outpatient Medications Prior to Visit  Medication Sig Dispense Refill  . esomeprazole (NEXIUM) 40 MG capsule Take 40 mg by mouth as needed.     Marland Kitchen LOTEMAX 0.5 % ophthalmic suspension   0  . rizatriptan (MAXALT) 10 MG tablet TAKE 1 TABLET BY MOUTH AS NEEDED FOR MIGRAINES; MAY REPEAT IN 2 HOURS IF NEEDED 12 tablet 11   No facility-administered medications prior to visit.     PAST MEDICAL HISTORY: Past Medical History:  Diagnosis Date  . HA (headache)     PAST SURGICAL HISTORY: Past Surgical History:  Procedure Laterality Date  . LAPAROSCOPIC TOTAL HYSTERECTOMY  8/ 2016  . TYMPANOPLASTY Left     FAMILY HISTORY: No family history on file.  SOCIAL HISTORY: Social History   Socioeconomic History  . Marital status: Married    Spouse name: Percell Miller  . Number of children: o  . Years of education: college  . Highest education level: Not on file  Occupational History    Comment: [employer]  . Financial resource strain: Not on file  . Food insecurity    Worry: Not on file    Inability: Not on file  . Transportation needs    Medical: Not on file    Non-medical: Not on file  Tobacco Use  . Smoking status: Never Smoker  . Smokeless tobacco: Never Used  Substance and Sexual Activity  . Alcohol use: No  . Drug use: No  . Sexual activity: Not on file  Lifestyle  . Physical activity    Days per week: Not on file    Minutes per session: Not on file  . Stress: Not on file  Relationships  . Social Herbalist on phone: Not on file    Gets together: Not on file    Attends religious service: Not on file    Active member of club or organization: Not on file    Attends meetings of clubs or organizations: Not on file    Relationship status: Not on file  . Intimate partner violence    Fear of current or ex partner: Not on file    Emotionally abused: Not on file    Physically abused: Not on file    Forced sexual activity: Not on file  Other  Topics Concern  . Not on file  Social History Narrative   Patient lives at home with her husband.Percell Miller). Patient works for [employer] full time.   Education college.   Right handed.   Caffeine None.            PHYSICAL EXAM  There were no vitals filed for this visit. There is no height or weight on file to calculate BMI.  Generalized: Well developed, in no acute distress   Neurological examination  Mentation: Alert oriented to time, place, history taking. Follows all commands speech and language fluent Cranial nerve II-XII: Pupils were equal round reactive to light. Extraocular movements were full, visual field were full on confrontational test. Facial sensation and strength were normal. Uvula tongue midline. Head turning and shoulder shrug  were normal and symmetric. Motor: The motor testing reveals 5 over 5 strength of all 4 extremities. Good symmetric motor  tone is noted throughout.  Sensory: Sensory testing is intact to soft touch on all 4 extremities. No evidence of extinction is noted.  Coordination: Cerebellar testing reveals good finger-nose-finger and heel-to-shin bilaterally.  Gait and station: Gait is normal. Tandem gait is normal. Romberg is negative. No drift is seen.  Reflexes: Deep tendon reflexes are symmetric and normal bilaterally.   DIAGNOSTIC DATA (LABS, IMAGING, TESTING) - I reviewed patient records, labs, notes, testing and imaging myself where available.  No results found for: WBC, HGB, HCT, MCV, PLT No results found for: NA, K, CL, CO2, GLUCOSE, BUN, CREATININE, CALCIUM, PROT, ALBUMIN, AST, ALT, ALKPHOS, BILITOT, GFRNONAA, GFRAA No results found for: CHOL, HDL, LDLCALC, LDLDIRECT, TRIG, CHOLHDL No results found for: [id number] No results found for: VITAMINB12 No results found for: TSH    ASSESSMENT AND PLAN 43 y.o. year old [name]  has a past medical history of HA (headache). here with:  1. Migraine   She has had an increase in her headaches,  likely related to stress at her work.  She describes her typical migraine pattern. She reports she is having almost daily headache, having to take frequent Maxalt.  She will start taking Effexor 37.5 mg at bedtime x 1 week, then increase to 75 mg at bedtime.  She will call for dose adjustments if needed.  I will send in a prescription of Zofran for nausea, Maxalt as needed.   I spent 15 minutes with the patient. 50% of this time was spent discussing her plan of care.   Margie EgeSarah Khaliel Morey, AGNP-C, DNP 12/04/2018, 8:24 AM [facility] [address], [address] [place], [place] [id number] [phone]

## 2018-12-16 ENCOUNTER — Telehealth: Payer: Self-pay

## 2018-12-16 MED ORDER — TOPIRAMATE 25 MG PO TABS: ORAL_TABLET | ORAL | 3 refills | Status: DC

## 2018-12-16 NOTE — Telephone Encounter (Signed)
Patient states she took effexor for one week. She is feeling emotional and her jaw hurts and she is itching. She will not take it again. I stated message will be sent to RN.

## 2018-12-16 NOTE — Telephone Encounter (Signed)
Please call the patient. She can stop the Effexor. We will try Topamax. Start taking 25 mg at bedtime x 3 days, then take 2 tablets at bedtime x 3 days, then take 3 tablets. Please discuss side effects. I sent in a new prescription.

## 2018-12-16 NOTE — Addendum Note (Signed)
Addended by: Suzzanne Cloud on: 12/16/2018 12:26 PM   Modules accepted: Orders

## 2018-12-16 NOTE — Telephone Encounter (Signed)
I called pts mobile.  LMVM re: plan. Stop effexor, start topamax titration as listed per SS/NP.  I relayed possible SE, tiredness, drowsiness, tingling hands/feet, weight loss.  She is to call me back for questions.  Medication already call in.

## 2019-03-09 ENCOUNTER — Ambulatory Visit: Admitting: Neurology

## 2019-04-17 DIAGNOSIS — R059 Cough, unspecified: Secondary | ICD-10-CM | POA: Insufficient documentation

## 2019-12-07 ENCOUNTER — Telehealth: Payer: Self-pay | Admitting: Neurology

## 2019-12-07 MED ORDER — RIZATRIPTAN BENZOATE 10 MG PO TABS: ORAL_TABLET | ORAL | 0 refills | Status: DC

## 2019-12-07 NOTE — Addendum Note (Signed)
Addended by: Guy Begin on: 12/07/2019 11:25 AM   Modules accepted: Orders

## 2019-12-07 NOTE — Telephone Encounter (Signed)
Patient called stating she is needing a refill of Rizatriptan sent to [facility] on [address].

## 2019-12-07 NOTE — Telephone Encounter (Signed)
refilled 30 days, relayed needed appt.

## 2020-01-14 ENCOUNTER — Other Ambulatory Visit: Payer: Self-pay | Admitting: Neurology

## 2020-01-14 MED ORDER — RIZATRIPTAN BENZOATE 10 MG PO TABS: ORAL_TABLET | ORAL | 0 refills | Status: DC

## 2020-01-14 NOTE — Telephone Encounter (Signed)
Patient has FU scheduled. Maxalt refilled per previous refill.

## 2020-01-14 NOTE — Telephone Encounter (Signed)
Pt request refill rizatriptan (MAXALT) 10 MG tablet at [facility] [phone].

## 2020-02-18 ENCOUNTER — Telehealth: Payer: Self-pay | Admitting: Neurology

## 2020-02-18 MED ORDER — RIZATRIPTAN BENZOATE 10 MG PO TABS: ORAL_TABLET | ORAL | 2 refills | Status: DC

## 2020-02-18 NOTE — Telephone Encounter (Signed)
Pt is requesting a refill for rizatriptan (MAXALT) 10 MG tablet .  Pharmacy: [facility] [phone]

## 2020-02-18 NOTE — Addendum Note (Signed)
Addended by: Hermenia Fiscal S on: 02/18/2020 11:27 AM   Modules accepted: Orders

## 2020-02-18 NOTE — Telephone Encounter (Signed)
Has appt in 04/2020 with SS/NP.

## 2020-03-10 ENCOUNTER — Telehealth: Payer: Self-pay | Admitting: Neurology

## 2020-03-10 NOTE — Telephone Encounter (Signed)
Received PA request from [facility] for rizatriptan 10mg . PA was started on . Key is [email or web address]. PA was instantly approved 02/09/20 to 03/20/20. Will fax a copy of the determination to pharmacy.

## 2020-04-19 NOTE — Progress Notes (Signed)
PATIENT: Carolyn [name] DOB: [birth year]  REASON FOR VISIT: follow up HISTORY FROM: patient  HISTORY OF PRESENT ILLNESS: Today 04/20/20  HISTORY  HISTORYMerlita [name] is a 44 years old right-handed Asian female, referred by her primary care physician for evaluation of frequent headaches, She reported a history of migraine headaches since high school, getting worse over the past one year, to almost daily basis, her headache usually triggered by hungry, and Eye-Strain, her typical migraine it is lateralized severe pounding headaches with associated light, noise sensitivity, lasting couple hours, sometimes of days, Over the past one year, she has self medicated herself with almost daily ibuprofen, naproxen, has developed a rash, She presented to the emergency room 2 weeks ago following one episode of severe headaches, was given prescription of Maxalt, last night, she tried it for the first time, which has been very helpful, there was no significant side effect UPDATE December 20 2014: She reported that nortriptyline has been very helpful for her migraine headaches, but she is no longer taking it, she complains of mild GI side effect at nighttime, but she also has the habit of eating before going to sleep, she is taking Maxalt as needed, help for most of the time, but sometimes, she has headaches back-to-back, severe nausea, vomiting, repeat dose of Maxalt does not take away the headaches, she continue have almost daily headaches for 2 weeks now, She is planning on to hysterectomy for fibroids in August 2016, is currently receiving Depo shots   UPDATE 03/20/17[name], 44 year old female returns for follow-up. She had a hysterectomy for fibroids in August she is no longer taking her Depo shots. Her headaches are much improved. She has stopped her nortriptyline 10 mg at night. She takes Maxalt acutely. She has not had to take the Imitrex injections. She has prescription glasses . She is watching  her food triggers. She now has a rare migraine relieved with Maxalt. She returns for reevaluation UPDATE 06/22/2018CMMs. [name], 44 year old female returns for follow-up with history of migraine headaches. She was on a preventative nortriptyline. Medication. She does not have migraines to warrant a preventative at this time. She remains on Maxalt but complains of drowsiness. She does not want to take sumatriptan injection. Heat is a big migraine trigger for her and she about tries to avoid her food triggers as well. She returns for reevaluation UPDATE6/24/2019CMMs. Carolyn [name], 44 year old female returns for follow-up with history of migraine headaches. She has 1-2 headaches per month which does not warrant a preventive medication. She has been on nortriptyline in the past. 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 She returns for reevaluation  Update December 04, 2018 Carolyn [name]: 44 year old female, history of migraine headaches.  She is not currently on preventative medication. For the past several months, she has had almost daily headache, attributes this to stress at work.  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  She describes her typical headache as left frontal, retro-orbital, photophobia, nausea/vomiting, sensitive to smells.  In the past she tried nortriptyline, but had side effect.  She describes this as her typical migraine.  She has been having daily Maxalt.   She had MRI of the brain in June 2019 at [facility] (found in Care Everywhere).  No acute intracranial abnormality, minimal ethmoid sinus disease  Update April 20, 2020 Carolyn [name]: At last visit, was placed on Effexor for headache, reported feeling emotional, jaw pain, itching, was stopped.  Switch  to Topamax working up to 75 mg at bedtime, but never started.  Currently reporting 4-5 migraines a week, works in dietary at [employer], foods and smells  (spices) are triggers, has not been eating a good diet to avoid migraines.  Taking 1/2-1 tablet Maxalt 4-5 times a week, or BC powders.  Headaches are bilateral temples, throbbing, photophobia, phonophobia, nausea.  Has not had severe headache associated with vomiting as before.   REVIEW OF SYSTEMS: Out of a complete 14 system review of symptoms, the patient complains only of the following symptoms, and all other reviewed systems are negative.  Headache   ALLERGIES: Allergies  Allergen Reactions  . Naproxen     rash  . Relpax  [Eletriptan Hydrobromide]     HOME MEDICATIONS: Outpatient Medications Prior to Visit  Medication Sig Dispense Refill  . ondansetron (ZOFRAN ODT) 4 MG disintegrating tablet Take 1 tablet (4 mg total) by mouth every 8 (eight) hours as needed for nausea or vomiting. 20 tablet 1  . rizatriptan (MAXALT) 10 MG tablet TAKE 1 TABLET BY MOUTH AS NEEDED FOR MIGRAINES; MAY REPEAT IN 2 HOURS IF NEEDED 12 tablet 2  . topiramate (TOPAMAX) 25 MG tablet Take 1 tablet at bedtime x 3 days, then take 2 tablets x 3 days, then take 3 tablets at bedtime 120 tablet 3   No facility-administered medications prior to visit.    PAST MEDICAL HISTORY: Past Medical History:  Diagnosis Date  . HA (headache)     PAST SURGICAL HISTORY: Past Surgical History:  Procedure Laterality Date  . LAPAROSCOPIC TOTAL HYSTERECTOMY  8/ 2016  . TYMPANOPLASTY Left     FAMILY HISTORY: No family history on file.  SOCIAL HISTORY: Social History   Socioeconomic History  . Marital status: Married    Spouse name: Ramon Dredge  . Number of children: o  . Years of education: college  . Highest education level: Not on file  Occupational History    Comment: [employer]  Tobacco Use  . Smoking status: Never Smoker  . Smokeless tobacco: Never Used  Substance and Sexual Activity  . Alcohol use: No  . Drug use: No  . Sexual activity: Not on file  Other Topics Concern  . Not on file  Social History  Narrative   Patient lives at home with her husband.Ramon Dredge). Patient works for [employer] full time.   Education college.   Right handed.   Caffeine None.         Social Determinants of Health   Financial Resource Strain:   . Difficulty of Paying Living Expenses: Not on file  Food Insecurity:   . Worried About Programme researcher, broadcasting/film/video in the Last Year: Not on file  . Ran Out of Food in the Last Year: Not on file  Transportation Needs:   . Lack of Transportation (Medical): Not on file  . Lack of Transportation (Non-Medical): Not on file  Physical Activity:   . Days of Exercise per Week: Not on file  . Minutes of Exercise per Session: Not on file  Stress:   . Feeling of Stress : Not on file  Social Connections:   . Frequency of Communication with Friends and Family: Not on file  . Frequency of Social Gatherings with Friends and Family: Not on file  . Attends Religious Services: Not on file  . Active Member of Clubs or Organizations: Not on file  . Attends Banker Meetings: Not on file  . Marital Status: Not on  file  Intimate Partner Violence:   . Fear of Current or Ex-Partner: Not on file  . Emotionally Abused: Not on file  . Physically Abused: Not on file  . Sexually Abused: Not on file   PHYSICAL EXAM  Vitals:   04/20/20 0934  BP: 124/82  Pulse: 80  Weight: 114 lb 6.4 oz (51.9 kg)  Height: 4\' 11"  (1.499 m)   Body mass index is 23.11 kg/m.  Generalized: Well developed, in no acute distress   Neurological examination  Mentation: Alert oriented to time, place, history taking. Follows all commands speech and language fluent Cranial nerve II-XII: Pupils were equal round reactive to light. Extraocular movements were full, visual field were full on confrontational test. Facial sensation and strength were normal. Head turning and shoulder shrug  were normal and symmetric. Motor: The motor testing reveals 5 over 5 strength of all 4 extremities. Good symmetric  motor tone is noted throughout.  Sensory: Sensory testing is intact to soft touch on all 4 extremities. No evidence of extinction is noted.  Coordination: Cerebellar testing reveals good finger-nose-finger and heel-to-shin bilaterally.  Gait and station: Gait is normal.   Reflexes: Deep tendon reflexes are symmetric and normal bilaterally.   DIAGNOSTIC DATA (LABS, IMAGING, TESTING) - I reviewed patient records, labs, notes, testing and imaging myself where available.  No results found for: WBC, HGB, HCT, MCV, PLT No results found for: NA, K, CL, CO2, GLUCOSE, BUN, CREATININE, CALCIUM, PROT, ALBUMIN, AST, ALT, ALKPHOS, BILITOT, GFRNONAA, GFRAA No results found for: CHOL, HDL, LDLCALC, LDLDIRECT, TRIG, CHOLHDL No results found for: No results found for: VITAMINB12 No results found for: TSH  ASSESSMENT AND PLAN 44 y.o. year old female  has a past medical history of HA (headache). here with:  1.  Chronic migraine headache -Previously tried and failed nortriptyline, Effexor -Never started Topamax, reinitiate, working up to 75 mg at bedtime for migraine prevention -Likely rebound headache component to frequency, limit only treating severe headaches with Maxalt -If cannot tolerate Topamax, may consider propanolol, or CGRP -Encouraged to avoid migraine triggers as possible -MRI of the brain in June 2019 at [facility] showed no acute intracranial abnormality, minimal ethmoid sinus disease -Follow-up in 4 months or sooner if needed, to ensure improvement in migraines  I spent 20 minutes of face-to-face and non-face-to-face time with patient.  This included previsit chart review, lab review, study review, order entry, electronic health record documentation, patient education.  [facility], AGNP-C, DNP 04/20/2020, 10:06 AM [facility] [address] [place], [place] [phone]

## 2020-04-20 ENCOUNTER — Ambulatory Visit: Admitting: Neurology

## 2020-04-20 ENCOUNTER — Encounter: Payer: Self-pay | Admitting: Neurology

## 2020-04-20 VITALS — BP 124/82 | HR 80 | Ht 59.0 in | Wt 114.4 lb

## 2020-04-20 DIAGNOSIS — G43009 Migraine without aura, not intractable, without status migrainosus: Secondary | ICD-10-CM

## 2020-04-20 MED ORDER — TOPIRAMATE 25 MG PO TABS: ORAL_TABLET | ORAL | 11 refills | Status: DC

## 2020-04-20 MED ORDER — RIZATRIPTAN BENZOATE 10 MG PO TABS: ORAL_TABLET | ORAL | 11 refills | Status: DC

## 2020-04-20 NOTE — Patient Instructions (Addendum)
Start the Topamax taking 1 tablet at bedtime x 3 days, then take 2 tablets at bedtime x 3 days, then take 3 at bedtime, stay at this dose  Continue maxalt for severe headaches, limit to only taking 2-3 times a week  Avoid triggers for migraines  See you back in 4 months   Topiramate tablets What is this medicine? TOPIRAMATE (toe PYRE a mate) is used to treat seizures in adults or children with epilepsy. It is also used for the prevention of migraine headaches. This medicine may be used for other purposes; ask your health care provider or pharmacist if you have questions. COMMON BRAND NAME(S): Topamax, Topiragen What should I tell my health care provider before I take this medicine? They need to know if you have any of these conditions:  bleeding disorders  kidney disease  lung or breathing disease, like asthma  suicidal thoughts, plans, or attempt; a previous suicide attempt by you or a family member  an unusual or allergic reaction to topiramate, other medicines, foods, dyes, or preservatives  pregnant or trying to get pregnant  breast-feeding How should I use this medicine? Take this medicine by mouth with a glass of water. Follow the directions on the prescription label. Do not cut, crush or chew this medicine. Swallow the tablets whole. You can take it with or without food. If it upsets your stomach, take it with food. Take your medicine at regular intervals. Do not take it more often than directed. Do not stop taking except on your doctor's advice. A special MedGuide will be given to you by the pharmacist with each prescription and refill. Be sure to read this information carefully each time. Talk to your pediatrician regarding the use of this medicine in children. While this drug may be prescribed for children as young as 33 years of age for selected conditions, precautions do apply. Overdosage: If you think you have taken too much of this medicine contact a poison control center  or emergency room at once. NOTE: This medicine is only for you. Do not share this medicine with others. What if I miss a dose? If you miss a dose, take it as soon as you can. If your next dose is to be taken in less than 6 hours, then do not take the missed dose. Take the next dose at your regular time. Do not take double or extra doses. What may interact with this medicine? This medicine may interact with the following medications:  acetazolamide  alcohol  antihistamines for allergy, cough, and cold  aspirin and aspirin-like medicines  atropine  birth control pills  certain medicines for anxiety or sleep  certain medicines for bladder problems like oxybutynin, tolterodine  certain medicines for depression like amitriptyline, fluoxetine, sertraline  certain medicines for seizures like carbamazepine, phenobarbital, phenytoin, primidone, valproic acid, zonisamide  certain medicines for stomach problems like dicyclomine, hyoscyamine  certain medicines for travel sickness like scopolamine  certain medicines for Parkinson's disease like benztropine, trihexyphenidyl  certain medicines that treat or prevent blood clots like warfarin, enoxaparin, dalteparin, apixaban, dabigatran, and rivaroxaban  digoxin  general anesthetics like halothane, isoflurane, methoxyflurane, propofol  hydrochlorothiazide  ipratropium  lithium  medicines that relax muscles for surgery  metformin  narcotic medicines for pain  NSAIDs, medicines for pain and inflammation, like ibuprofen or naproxen  phenothiazines like chlorpromazine, mesoridazine, prochlorperazine, thioridazine  pioglitazone This list may not describe all possible interactions. Give your health care provider a list of all the medicines, herbs, non-prescription  drugs, or dietary supplements you use. Also tell them if you smoke, drink alcohol, or use illegal drugs. Some items may interact with your medicine. What should I watch  for while using this medicine? Visit your doctor or health care professional for regular checks on your progress. Tell your health care professional if your symptoms do not start to get better or if they get worse. Do not stop taking except on your health care professional's advice. You may develop a severe reaction. Your health care professional will tell you how much medicine to take. Wear a medical ID bracelet or chain. Carry a card that describes your disease and details of your medicine and dosage times. This medicine can reduce the response of your body to heat or cold. Dress warm in cold weather and stay hydrated in hot weather. If possible, avoid extreme temperatures like saunas, hot tubs, very hot or cold showers, or activities that can cause dehydration such as vigorous exercise. Check with your health care professional if you have severe diarrhea, nausea, and vomiting, or if you sweat a lot. The loss of too much body fluid may make it dangerous for you to take this medicine. You may get drowsy or dizzy. Do not drive, use machinery, or do anything that needs mental alertness until you know how this medicine affects you. Do not stand up or sit up quickly, especially if you are an older patient. This reduces the risk of dizzy or fainting spells. Alcohol may interfere with the effect of this medicine. Avoid alcoholic drinks. Tell your health care professional right away if you have any change in your eyesight. Patients and their families should watch out for new or worsening depression or thoughts of suicide. Also watch out for sudden changes in feelings such as feeling anxious, agitated, panicky, irritable, hostile, aggressive, impulsive, severely restless, overly excited and hyperactive, or not being able to sleep. If this happens, especially at the beginning of treatment or after a change in dose, call your healthcare professional. This medicine may cause serious skin reactions. They can happen  weeks to months after starting the medicine. Contact your health care provider right away if you notice fevers or flu-like symptoms with a rash. The rash may be red or purple and then turn into blisters or peeling of the skin. Or, you might notice a red rash with swelling of the face, lips or lymph nodes in your neck or under your arms. Birth control may not work properly while you are taking this medicine. Talk to your health care professional about using an extra method of birth control. Women should inform their health care professional if they wish to become pregnant or think they might be pregnant. There is a potential for serious side effects and harm to an unborn child. Talk to your health care professional for more information. What side effects may I notice from receiving this medicine? Side effects that you should report to your doctor or health care professional as soon as possible:  allergic reactions like skin rash, itching or hives, swelling of the face, lips, or tongue  blood in the urine  changes in vision  confusion  loss of memory  pain in lower back or side  pain when urinating  redness, blistering, peeling or loosening of the skin, including inside the mouth  signs and symptoms of bleeding such as bloody or black, tarry stools; red or dark brown urine; spitting up blood or brown material that looks like coffee grounds; red  spots on the skin; unusual bruising or bleeding from the eyes, gums, or nose  signs and symptoms of increased acid in the body like breathing fast; fast heartbeat; headache; confusion; unusually weak or tired; nausea, vomiting  suicidal thoughts, mood changes  trouble speaking or understanding  unusual sweating  unusually weak or tired Side effects that usually do not require medical attention (report to your doctor or health care professional if they continue or are bothersome):  dizziness  drowsiness  fever  loss of appetite  nausea,  vomiting  pain, tingling, numbness in the hands or feet  stomach pain  tiredness  upset stomach This list may not describe all possible side effects. Call your doctor for medical advice about side effects. You may report side effects to FDA at 1-800-FDA-1088. Where should I keep my medicine? Keep out of the reach of children. Store at room temperature between 15 and 30 degrees C (59 and 86 degrees F). Throw away any unused medicine after the expiration date. NOTE: This sheet is a summary. It may not cover all possible information. If you have questions about this medicine, talk to your doctor, pharmacist, or health care provider.  2020 Elsevier/Gold Standard (2018-12-25 15:07:20)

## 2020-04-26 ENCOUNTER — Telehealth: Payer: Self-pay | Admitting: Neurology

## 2020-04-26 MED ORDER — ONDANSETRON 4 MG PO TBDP: 4.0000 mg | ORAL_TABLET | Freq: Three times a day (TID) | ORAL | 1 refills | Status: DC | PRN

## 2020-04-26 NOTE — Telephone Encounter (Signed)
Called pt.  She took 25mg  po daily for 3 days no problem, when increased to 50mg  had reaction, anxiety, reddened skin, itchy, she stopped and now back to baseline.  She is hydrating, having some nausea/vomiting zofran ordered.

## 2020-04-26 NOTE — Telephone Encounter (Signed)
See other note. Done.  

## 2020-04-26 NOTE — Telephone Encounter (Signed)
Pt called,  Discontinued taking topiramate (TOPAMAX) 25 MG tablet 2 nights ago. Because makes me feel nausea, arm turns red and have an anxiety. Would like a call from the nurse.

## 2020-04-26 NOTE — Telephone Encounter (Signed)
I called pt and LMVM for her that stop topamax, she already had.  Two potential medication options are propranolol, or CGRP injectable like ajovy, aimovig, emgality.  Pt to call me back.

## 2020-04-26 NOTE — Telephone Encounter (Signed)
She can stay off the Topamax if adverse effect. Give it a few days, if she wants to try something else, could try propranolol if never tried (I see no history of asthma, HR was 80 at last visit) or CGRP (injection).

## 2020-04-26 NOTE — Telephone Encounter (Signed)
Pt request refill ondansetron (ZOFRAN ODT) 4 MG disintegrating tablet at [facility] [phone]

## 2020-04-27 NOTE — Telephone Encounter (Signed)
LMVM for pt to return call.   

## 2020-04-27 NOTE — Telephone Encounter (Signed)
I spoke to pt and she would like to try again taking the topiramate 25mg  po daily and she how she does, not increasing dose but staying on 25mg  po daily.  I relayed that she can try and let know how she does.  She was not interested in taking other options at this time.  I would relay to SS/NP.

## 2020-08-16 ENCOUNTER — Ambulatory Visit: Admitting: Neurology

## 2020-11-17 ENCOUNTER — Telehealth: Payer: Self-pay | Admitting: Neurology

## 2020-11-17 NOTE — Telephone Encounter (Signed)
I called pharmacy she stated husband has just picked up the medication.

## 2020-11-17 NOTE — Telephone Encounter (Signed)
Pt called and is needing a refill on her rizatriptan (MAXALT) 10 MG tablet sent in to the [facility] off [address]

## 2021-01-04 ENCOUNTER — Other Ambulatory Visit: Payer: Self-pay | Admitting: *Deleted

## 2021-01-04 MED ORDER — ONDANSETRON 4 MG PO TBDP: 4.0000 mg | ORAL_TABLET | Freq: Three times a day (TID) | ORAL | 1 refills | Status: DC | PRN

## 2021-04-27 ENCOUNTER — Other Ambulatory Visit: Payer: Self-pay | Admitting: *Deleted

## 2021-04-27 MED ORDER — RIZATRIPTAN BENZOATE 10 MG PO TABS: ORAL_TABLET | ORAL | 0 refills | Status: DC

## 2021-05-24 ENCOUNTER — Other Ambulatory Visit: Payer: Self-pay | Admitting: Neurology

## 2021-05-29 ENCOUNTER — Ambulatory Visit: Admitting: Neurology

## 2021-06-06 DIAGNOSIS — R030 Elevated blood-pressure reading, without diagnosis of hypertension: Secondary | ICD-10-CM | POA: Insufficient documentation

## 2021-06-20 ENCOUNTER — Other Ambulatory Visit: Payer: Self-pay

## 2021-06-20 MED ORDER — ONDANSETRON 4 MG PO TBDP: 4.0000 mg | ORAL_TABLET | Freq: Three times a day (TID) | ORAL | 1 refills | Status: DC | PRN

## 2021-06-20 NOTE — Progress Notes (Signed)
Rx refilled.

## 2021-06-22 ENCOUNTER — Other Ambulatory Visit: Payer: Self-pay | Admitting: *Deleted

## 2021-06-22 MED ORDER — RIZATRIPTAN BENZOATE 10 MG PO TABS: ORAL_TABLET | ORAL | 2 refills | Status: DC

## 2021-07-31 DIAGNOSIS — J029 Acute pharyngitis, unspecified: Secondary | ICD-10-CM | POA: Insufficient documentation

## 2021-09-25 ENCOUNTER — Telehealth: Payer: Self-pay | Admitting: Neurology

## 2021-09-25 NOTE — Telephone Encounter (Signed)
Patient has not been seen since 2021 and cancelled her follow up scheduled for this week. We cannot refill PRN medications for her without yearly visits. ?

## 2021-09-25 NOTE — Telephone Encounter (Signed)
Pt is requesting a refill for rizatriptan (MAXALT) 10 MG tablet. ? ?Pharmacy: [facility] [address] ? ?

## 2021-09-26 MED ORDER — RIZATRIPTAN BENZOATE 10 MG PO TABS: ORAL_TABLET | ORAL | 0 refills | Status: DC

## 2021-09-26 NOTE — Telephone Encounter (Signed)
Pt husband scheduled appt for pt.  ?Requesting refill for rizatriptan (MAXALT) 10 MG tablet at [facility].  ? ?Pt is [place] and friend flying [place] t[place] to give her medication. Pt husband requesting refill that he can pick up today or tomorrow.  ?

## 2021-09-26 NOTE — Addendum Note (Signed)
Addended by: Lindell Spar C on: 09/26/2021 01:10 PM ? ? Modules accepted: Orders ? ?

## 2021-09-26 NOTE — Telephone Encounter (Addendum)
Patient was last seen 04/20/2020 and instructed to return in four months. She has not been back to our office. Cancelled appts on 08/16/20 (could not get off work), 05/29/21 (provider out but pt did not call back to r/s) and 09/28/21 (out of the country).  ? ?She currently has an appt pending 02/28/22.  ? ?Last DPR scanned to chart 05/17/2010 and does not have her husband listed. We are unable to call him back. ? ? ?

## 2021-09-26 NOTE — Telephone Encounter (Signed)
Per vo by Judson Roch, she will allow a one time refill of rizatriptan due to the circumstances. The patient will need to be seen sooner than September for her next follow up, in order to continue the medication. Otherwise, she will need to discuss with her PCP.  ? ?I have called the patient and left a detailed message with the information above. I also informed her that we are unable to call her husband back. She may want to update her DPR with [facility]. Right now, no one is listed as a contact. I also sent her a mychart invitation, in case it is easier to communicate with emails. I told her to be on the lookout for the directions, if she chooses to set up the acct.  ?

## 2021-09-28 ENCOUNTER — Ambulatory Visit: Admitting: Neurology

## 2021-10-25 ENCOUNTER — Telehealth: Payer: Self-pay | Admitting: Neurology

## 2021-10-25 MED ORDER — RIZATRIPTAN BENZOATE 10 MG PO TABS: ORAL_TABLET | ORAL | 4 refills | Status: DC

## 2021-10-25 NOTE — Telephone Encounter (Signed)
Patient called our office and has scheduled an appt for 02/28/22. Refills will be provided to that date.  ?

## 2021-10-25 NOTE — Telephone Encounter (Signed)
Pt called needing a refill request for her rizatriptan (MAXALT) 10 MG tablet sent to the [facility] on [address].  ?

## 2021-11-25 DIAGNOSIS — L740 Miliaria rubra: Secondary | ICD-10-CM | POA: Insufficient documentation

## 2022-02-27 NOTE — Progress Notes (Deleted)
PATIENT: Carolyn [name] DOB: 01-27-1976  REASON FOR VISIT: follow up HISTORY FROM: patient  HISTORY OF PRESENT ILLNESS: Today 02/27/22  HISTORY  HISTORYMerlita [name] is a 46 years old right-handed Asian female, referred by her primary care physician for evaluation of frequent headaches, She reported a history of migraine headaches since high school, getting worse over the past one year, to almost daily basis, her headache usually triggered by hungry, and Eye-Strain, her typical migraine it is lateralized severe pounding headaches with associated light, noise sensitivity, lasting couple hours, sometimes of days, Over the past one year, she has self medicated herself with almost daily ibuprofen, naproxen, has developed a rash, She presented to the emergency room 2 weeks ago following one episode of severe headaches, was given prescription of Maxalt, last night, she tried it for the first time, which has been very helpful, there was no significant side effect UPDATE December 20 2014: She reported that nortriptyline has been very helpful for her migraine headaches, but she is no longer taking it, she complains of mild GI side effect at nighttime, but she also has the habit of eating before going to sleep, she is taking Maxalt as needed, help for most of the time, but sometimes, she has headaches back-to-back, severe nausea, vomiting, repeat dose of Maxalt does not take away the headaches, she continue have almost daily headaches for 2 weeks now, She is planning on to hysterectomy for fibroids in August 2016, is currently receiving Depo shots     UPDATE 03/20/17[name], 46 year old female returns for follow-up. She had a hysterectomy for fibroids in August she is no longer taking her Depo shots. Her headaches are much improved. She has stopped her  nortriptyline 10 mg at night. She takes Maxalt acutely. She has not had to take the Imitrex injections. She has prescription glasses . She is watching  her  food triggers. She now has a rare migraine relieved with Maxalt. She returns for reevaluation  UPDATE 06/22/2018[name], 46 year old female returns for follow-up with history of migraine headaches. She was on a preventative nortriptyline. Medication. She does not have migraines to warrant a preventative at this time. She remains on Maxalt but complains of drowsiness. She does not want to take sumatriptan injection. Heat  is a big migraine trigger for her and she about tries to avoid her food triggers as well. She returns for reevaluation UPDATE 6/24/2019[name], 46 year old female returns for follow-up with history of migraine headaches.  She has 1-2 headaches per month which does not warrant a preventive medication.  She has been on nortriptyline in the past.  Heat is a trigger along with certain foods.  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  She returns for reevaluation   Update December 04, 2018 SS: 46 year old female, history of migraine headaches.  She is not currently on preventative medication. For the past several months, she has had almost daily headache, attributes this to stress at work.  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  She describes her typical headache as left frontal, retro-orbital, photophobia, nausea/vomiting, sensitive to smells.  In the past she tried nortriptyline, but had side effect.  She describes this as her typical migraine.  She has been having daily Maxalt.    She had MRI of the brain in June 2019 at [facility] (found in Care Everywhere).  No acute intracranial abnormality, minimal ethmoid sinus disease  Update April 20, 2020 SS: At  last visit, was placed on Effexor for headache, reported feeling emotional, jaw pain, itching, was stopped.  Switch to Topamax working up to 75 mg at bedtime, but never started.  Currently reporting 4-5 migraines a week, works in dietary at [employer], foods and smells  (spices) are triggers, has not been eating a good diet to avoid migraines.  Taking 1/2-1 tablet Maxalt 4-5 times a week, or BC powders.  Headaches are bilateral temples, throbbing, photophobia, phonophobia, nausea.  Has not had severe headache associated with vomiting as before.   Update February 28, 2022 SS:   REVIEW OF SYSTEMS: Out of a complete 14 system review of symptoms, the patient complains only of the following symptoms, and all other reviewed systems are negative.  Headache   ALLERGIES: Allergies  Allergen Reactions   Naproxen     rash   Relpax  [Eletriptan Hydrobromide]    Topamax [Topiramate]     Higher dose felt 50mg  ( reddened skin, anxiety, itch    HOME MEDICATIONS: Outpatient Medications Prior to Visit  Medication Sig Dispense Refill   ondansetron (ZOFRAN ODT) 4 MG disintegrating tablet Take 1 tablet (4 mg total) by mouth every 8 (eight) hours as needed for nausea or vomiting. 20 tablet 1   rizatriptan (MAXALT) 10 MG tablet Take 1 tab at onset of migraine. May repeat in 2 hrs, if needed. Max dose: 2 tabs/day or 12/30days. Must be seen for any further refills. 12 tablet 4   topiramate (TOPAMAX) 25 MG tablet Take 1 tablet at bedtime x 3 days, then take 2 tablets at bedtime x 3 days, then take 3 tablets at bedtime 90 tablet 11   No facility-administered medications prior to visit.    PAST MEDICAL HISTORY: Past Medical History:  Diagnosis Date   HA (headache)     PAST SURGICAL HISTORY: Past Surgical History:  Procedure Laterality Date   LAPAROSCOPIC TOTAL HYSTERECTOMY  8/ 2016   TYMPANOPLASTY Left     FAMILY HISTORY: No family history on file.  SOCIAL HISTORY: Social History   Socioeconomic History   Marital status: Married    Spouse name: Percell Miller   Number of children: o   Years of education: college   Highest education level: Not on file  Occupational History    Comment: [employer]  Tobacco Use   Smoking status: Never   Smokeless tobacco: Never   Substance and Sexual Activity   Alcohol use: No   Drug use: No   Sexual activity: Not on file  Other Topics Concern   Not on file  Social History Narrative   Patient lives at home with her husband.Percell Miller). Patient works for [employer] full time.   Education college.   Right handed.   Caffeine None.         Social Determinants of Health   Financial Resource Strain: Not on file  Food Insecurity: Not on file  Transportation Needs: Not on file  Physical Activity: Not on file  Stress: Not on file  Social Connections: Not on file  Intimate Partner Violence: Not on file   PHYSICAL EXAM  There were no vitals filed for this visit.  There is no height or weight on file to calculate BMI.  Generalized: Well developed, in no acute distress   Neurological examination  Mentation: Alert oriented to time, place, history taking. Follows all commands speech and language fluent Cranial nerve II-XII: Pupils were equal round reactive to light. Extraocular movements were full, visual field were full on confrontational  test. Facial sensation and strength were normal. Head turning and shoulder shrug  were normal and symmetric. Motor: The motor testing reveals 5 over 5 strength of all 4 extremities. Good symmetric motor tone is noted throughout.  Sensory: Sensory testing is intact to soft touch on all 4 extremities. No evidence of extinction is noted.  Coordination: Cerebellar testing reveals good finger-nose-finger and heel-to-shin bilaterally.  Gait and station: Gait is normal.   Reflexes: Deep tendon reflexes are symmetric and normal bilaterally.   DIAGNOSTIC DATA (LABS, IMAGING, TESTING) - I reviewed patient records, labs, notes, testing and imaging myself where available.  No results found for: "WBC", "HGB", "HCT", "MCV", "PLT" No results found for: "NA", "K", "CL", "CO2", "GLUCOSE", "BUN", "CREATININE", "CALCIUM", "PROT", "ALBUMIN", "AST", "ALT", "ALKPHOS", "BILITOT", "GFRNONAA",  "GFRAA" No results found for: "CHOL", "HDL", "LDLCALC", "LDLDIRECT", "TRIG", "CHOLHDL" No results found for: "HGBA1C" No results found for: "VITAMINB12" No results found for: "TSH"  ASSESSMENT AND PLAN 46 y.o. year old female  has a past medical history of HA (headache). here with:  1.  Chronic migraine headache -Previously tried and failed nortriptyline, Effexor -Never started Topamax, reinitiate, working up to 75 mg at bedtime for migraine prevention -Likely rebound headache component to frequency, limit only treating severe headaches with Maxalt -If cannot tolerate Topamax, may consider propanolol, or CGRP -Encouraged to avoid migraine triggers as possible -MRI of the brain in June 2019 at [facility] showed no acute intracranial abnormality, minimal ethmoid sinus disease -Follow-up in 4 months or sooner if needed, to ensure improvement in migraines    Margie Ege, AGNP-C, DNP 02/27/2022, 2:36 PM [facility] [address], [address] [place], [place] [address] [phone]

## 2022-02-28 ENCOUNTER — Encounter: Payer: Self-pay | Admitting: Neurology

## 2022-02-28 ENCOUNTER — Ambulatory Visit: Admitting: Neurology

## 2022-03-07 DIAGNOSIS — Z0289 Encounter for other administrative examinations: Secondary | ICD-10-CM

## 2022-03-14 ENCOUNTER — Encounter: Payer: Self-pay | Admitting: Neurology

## 2022-03-14 ENCOUNTER — Ambulatory Visit: Admitting: Neurology

## 2022-03-14 VITALS — BP 139/91 | HR 86 | Ht 59.0 in | Wt 118.5 lb

## 2022-03-14 DIAGNOSIS — G43009 Migraine without aura, not intractable, without status migrainosus: Secondary | ICD-10-CM | POA: Diagnosis not present

## 2022-03-14 MED ORDER — SUMATRIPTAN SUCCINATE 100 MG PO TABS: 100.0000 mg | ORAL_TABLET | ORAL | 11 refills | Status: DC | PRN

## 2022-03-14 MED ORDER — AJOVY 225 MG/1.5ML ~~LOC~~ SOAJ: 225.0000 mg | SUBCUTANEOUS | 11 refills | Status: DC

## 2022-03-14 NOTE — Patient Instructions (Signed)
Start Ajovy once a month injection for migraine prevention, give it 3-4 months to see full benefit Try imitrex at onset of headache  Call for dose adjustment

## 2022-03-14 NOTE — Progress Notes (Signed)
PATIENT: Carolyn [name] DOB: 09-May-1976  REASON FOR [name]: follow up HISTORY FROM: patient PRIMARY NEUROLOGIST: Drexel Alavez is a 46 years old right-handed Asian [name], referred by her primary care physician for evaluation of frequent headaches, She reported a history of migraine headaches since high school, getting worse over the past one year, to almost daily basis, her headache usually triggered by hungry, and Eye-Strain, her typical migraine it is lateralized severe pounding headaches with associated light, noise sensitivity, lasting couple hours, sometimes of days, Over the past one year, she has self medicated herself with almost daily ibuprofen, naproxen, has developed a rash, She presented to the emergency room 2 weeks ago following one episode of severe headaches, was given prescription of Maxalt, last night, she tried it for the first time, which has been very helpful, there was no significant side effect UPDATE December 20 2014: She reported that nortriptyline has been very helpful for her migraine headaches, but she is no longer taking it, she complains of mild GI side effect at nighttime, but she also has the habit of eating before going to sleep, she is taking Maxalt as needed, help for most of the time, but sometimes, she has headaches back-to-back, severe nausea, vomiting, repeat dose of Maxalt does not take away the headaches, she continue have almost daily headaches for 2 weeks now, She is planning on to hysterectomy for fibroids in August 2016, is currently receiving Depo shots     UPDATE 03/20/17[name], 46 year old [name] returns for follow-up. She had a hysterectomy for fibroids in August she is no longer taking her Depo shots. Her headaches are much improved. She has stopped her  nortriptyline 10 mg at night. She takes Maxalt acutely. She has not had to take the Imitrex injections. She has prescription glasses . She is watching her  food triggers. She now has  a rare migraine relieved with Maxalt. She returns for reevaluation  UPDATE 06/22/2018[name], 46 year old [name] returns for follow-up with history of migraine headaches. She was on a preventative nortriptyline. Medication. She does not have migraines to warrant a preventative at this time. She remains on Maxalt but complains of drowsiness. She does not want to take sumatriptan injection. Heat  is a big migraine trigger for her and she about tries to avoid her food triggers as well. She returns for reevaluation UPDATE 6/24/2019[name], 47 year old [name] returns for follow-up with history of migraine headaches.  She has 1-2 headaches per month which does not warrant a preventive medication.  She has been on nortriptyline in the past.  Heat is a trigger along with certain foods.  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  She returns for reevaluation   Update December 04, 5879 [name], history of migraine headaches.  She is not currently on preventative medication. For the past several months, she has had almost daily headache, attributes this to stress at work.  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  She describes her typical headache as left frontal, retro-orbital, photophobia, nausea/vomiting, sensitive to smells.  In the past she tried nortriptyline, but had side effect.  She describes this as her typical migraine.  She has been having daily Maxalt.    She had MRI of the brain in June 2019 at [facility] (found in [place]).  No acute intracranial abnormality, minimal ethmoid sinus disease  Update April 20, 2020 Carolyn [name], was placed on Effexor  for headache, reported feeling emotional, jaw pain, itching, was stopped.  Switch to Topamax working up to 75 mg at bedtime, but never started.  Currently reporting 4-5 migraines a week, works in dietary at [employer], foods and smells (spices) are triggers, has not  been eating a good diet to avoid migraines.  Taking 1/2-1 tablet Maxalt 4-5 times a week, or BC powders.  Headaches are bilateral temples, throbbing, photophobia, phonophobia, nausea.  Has not had severe headache associated with vomiting as before.   Update March 14, 2022 SS: Carolyn [name], went to [place] for 3 months, her dad passed away from prostate cancer. 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 Increase in migraines, 2-3 a week. Takes Maxalt, it helps but she vomits it up, feels drowsy. Zofran sometimes doesn't help. No longer on Topamax, it caused GI upset.   REVIEW OF SYSTEMS: Out of a complete 14 system review of symptoms, the patient complains only of the following symptoms, and all other reviewed systems are negative.  Headache   ALLERGIES: Allergies  Allergen Reactions   Naproxen     rash   Relpax  [Eletriptan Hydrobromide]    Topamax [Topiramate]     Higher dose felt 50mg  ( reddened skin, anxiety, itch    HOME MEDICATIONS: Outpatient Medications Prior to [name]  Medication Sig Dispense Refill   ondansetron (ZOFRAN ODT) 4 MG disintegrating tablet Take 1 tablet (4 mg total) by mouth every 8 (eight) hours as needed for nausea or vomiting. 20 tablet 1   rizatriptan (MAXALT) 10 MG tablet Take 1 tab at onset of migraine. May repeat in 2 hrs, if needed. Max dose: 2 tabs/day or 12/30days. Must be seen for any further refills. 12 tablet 4   topiramate (TOPAMAX) 25 MG tablet Take 1 tablet at bedtime x 3 days, then take 2 tablets at bedtime x 3 days, then take 3 tablets at bedtime 90 tablet 11   No facility-administered medications prior to [name].    PAST MEDICAL HISTORY: Past Medical History:  Diagnosis Date   HA (headache)     PAST SURGICAL HISTORY: Past Surgical History:  Procedure Laterality Date   LAPAROSCOPIC TOTAL HYSTERECTOMY  8/ 2016   TYMPANOPLASTY Left     FAMILY HISTORY: History reviewed. No pertinent family history.  SOCIAL HISTORY: Social History    Socioeconomic History   Marital status: Married    Spouse name: Percell Miller   Number of children: o   Years of education: college   Highest education level: Not on file  Occupational History    Comment: [employer]  Tobacco Use   Smoking status: Never   Smokeless tobacco: Never  Substance and Sexual Activity   Alcohol use: No   Drug use: No   Sexual activity: Not on file  Other Topics Concern   Not on file  Social History Narrative   Patient lives at home with her husband.Percell Miller). Patient works for [employer] full time.   Education college.   Right handed.   Caffeine None.         Social Determinants of Health   Financial Resource Strain: Not on file  Food Insecurity: Not on file  Transportation Needs: Not on file  Physical Activity: Not on file  Stress: Not on file  Social Connections: Not on file  Intimate Partner Violence: Not on file   PHYSICAL EXAM  Vitals:   03/14/22 1254  BP: (!) 139/91  Pulse: 86  Weight: 118 lb 8 oz (53.8 kg)  Height: 4'  11" (1.499 m)   Body mass index is 23.93 kg/m.  Generalized: Well developed, in no acute distress   Neurological examination  Mentation: Alert oriented to time, place, history taking. Follows all commands speech and language fluent Cranial nerve II-XII: Pupils were equal round reactive to light. Extraocular movements were full, visual field were full on confrontational test. Facial sensation and strength were normal. Head turning and shoulder shrug  were normal and symmetric. Motor: The motor testing reveals 5 over 5 strength of all 4 extremities. Good symmetric motor tone is noted throughout.  Sensory: Sensory testing is intact to soft touch on all 4 extremities. No evidence of extinction is noted.  Coordination: Cerebellar testing reveals good finger-nose-finger and heel-to-shin bilaterally.  Gait and station: Gait is normal.   Reflexes: Deep tendon reflexes are symmetric and normal bilaterally.   DIAGNOSTIC DATA  (LABS, IMAGING, TESTING) - I reviewed patient records, labs, notes, testing and imaging myself where available.  No results found for: "WBC", "HGB", "HCT", "MCV", "PLT" No results found for: "NA", "K", "CL", "CO2", "GLUCOSE", "BUN", "CREATININE", "CALCIUM", "PROT", "ALBUMIN", "AST", "ALT", "ALKPHOS", "BILITOT", "GFRNONAA", "GFRAA" No results found for: "CHOL", "HDL", "LDLCALC", "LDLDIRECT", "TRIG", "CHOLHDL" No results found for: "HGBA1C" No results found for: "VITAMINB12" No results found for: "TSH"  ASSESSMENT AND PLAN 46 y.o. year old [name]  has a past medical history of HA (headache). here with:  1.  Chronic migraine headache -Start Ajovy 225 mg once a month injection for migraine prevention -Try Imitrex 100 mg as needed for acute headache -Previously tried and failed: Nortriptyline, Effexor, Topamax, Maxalt -Increase water intake -Call for dose adjustment, otherwise follow-up in 6 months  Butler Denmark, Laqueta Jean, DNP 03/14/2022, 1:13 PM [facility] [address] [place], [place] [address] [phone]

## 2022-03-28 ENCOUNTER — Telehealth: Payer: Self-pay | Admitting: Neurology

## 2022-03-28 NOTE — Telephone Encounter (Signed)
Pt would like a call to discuss the Imitrex not being covered by her insurance

## 2022-05-02 DIAGNOSIS — H6990 Unspecified Eustachian tube disorder, unspecified ear: Secondary | ICD-10-CM | POA: Insufficient documentation

## 2022-05-02 DIAGNOSIS — H9203 Otalgia, bilateral: Secondary | ICD-10-CM | POA: Insufficient documentation

## 2022-07-12 ENCOUNTER — Other Ambulatory Visit: Payer: Self-pay | Admitting: Neurology

## 2022-07-12 DIAGNOSIS — B349 Viral infection, unspecified: Secondary | ICD-10-CM | POA: Insufficient documentation

## 2022-07-16 NOTE — Telephone Encounter (Signed)
Rx refilled.

## 2022-09-27 ENCOUNTER — Ambulatory Visit: Admitting: Neurology

## 2022-10-27 DIAGNOSIS — J069 Acute upper respiratory infection, unspecified: Secondary | ICD-10-CM | POA: Insufficient documentation

## 2022-11-28 ENCOUNTER — Ambulatory Visit
Admission: EM | Admit: 2022-11-28 | Discharge: 2022-11-28 | Disposition: A | Discharge status: Home / Self Care | Attending: Nurse Practitioner | Admitting: Nurse Practitioner

## 2022-11-28 DIAGNOSIS — J069 Acute upper respiratory infection, unspecified: Secondary | ICD-10-CM | POA: Diagnosis present

## 2022-11-28 DIAGNOSIS — R110 Nausea: Secondary | ICD-10-CM

## 2022-11-28 DIAGNOSIS — U071 COVID-19: Secondary | ICD-10-CM | POA: Insufficient documentation

## 2022-11-28 DIAGNOSIS — J029 Acute pharyngitis, unspecified: Secondary | ICD-10-CM | POA: Diagnosis present

## 2022-11-28 LAB — POCT RAPID STREP A (OFFICE): Rapid Strep A Screen: NEGATIVE

## 2022-11-28 MED ORDER — IBUPROFEN 600 MG PO TABS: 600.0000 mg | ORAL_TABLET | Freq: Four times a day (QID) | ORAL | 0 refills | Status: DC | PRN

## 2022-11-28 MED ORDER — PROMETHAZINE-DM 6.25-15 MG/5ML PO SYRP: 5.0000 mL | ORAL_SOLUTION | Freq: Four times a day (QID) | ORAL | 0 refills | Status: DC | PRN

## 2022-11-28 MED ORDER — ONDANSETRON 4 MG PO TBDP: 4.0000 mg | ORAL_TABLET | Freq: Three times a day (TID) | ORAL | 0 refills | Status: DC | PRN

## 2022-11-28 NOTE — ED Provider Notes (Signed)
[facility]    CSN: [id number] Arrival date & time: 11/28/22  1205      History   Chief Complaint No chief complaint on file.   HPI Carolyn [name] is a 47 y.o. female  presents for evaluation of URI symptoms for 2 days. Patient reports associated symptoms of cough, congestion, sore throat, nausea, fever of 100.8. Denies V/D, ear pain, body aches, shortness of breath. Patient does not have a hx of asthma or smoking. No known sick contacts.  Pt has taken TheraFlu OTC for symptoms. Pt has no other concerns at this time.   HPI  Past Medical History:  Diagnosis Date   HA (headache)     Patient Active Problem List   Diagnosis Date Noted   Migraine 12/18/2013   HA (headache)     Past Surgical History:  Procedure Laterality Date   LAPAROSCOPIC TOTAL HYSTERECTOMY  8/ 2016   TYMPANOPLASTY Left     OB History   No obstetric history on file.      Home Medications    Prior to Admission medications   Medication Sig Start Date End Date Taking? Authorizing Provider  ibuprofen (ADVIL) 600 MG tablet Take 1 tablet (600 mg total) by mouth every 6 (six) hours as needed for fever, moderate pain or mild pain. 11/28/22  Yes Radford Pax, NP  ondansetron (ZOFRAN-ODT) 4 MG disintegrating tablet Take 1 tablet (4 mg total) by mouth every 8 (eight) hours as needed for nausea or vomiting. 11/28/22  Yes Radford Pax, NP  promethazine-dextromethorphan (PROMETHAZINE-DM) 6.25-15 MG/5ML syrup Take 5 mLs by mouth 4 (four) times daily as needed for cough. 11/28/22  Yes Radford Pax, NP  Fremanezumab-vfrm (AJOVY) 225 MG/1.5ML SOAJ Inject 225 mg into the skin every 30 (thirty) days. 03/14/22   Glean Salvo, NP  SUMAtriptan (IMITREX) 100 MG tablet Take 1 tablet (100 mg total) by mouth every 2 (two) hours as needed for migraine. May repeat in 2 hours if headache persists or recurs. 03/14/22   Glean Salvo, NP    Family History History reviewed. No pertinent family history.  Social  History Social History   Tobacco Use   Smoking status: Never   Smokeless tobacco: Never  Substance Use Topics   Alcohol use: No   Drug use: No     Allergies   Naproxen, Relpax  [eletriptan hydrobromide], and Topamax [topiramate]   Review of Systems Review of Systems  Constitutional:  Positive for fever.  HENT:  Positive for congestion and sore throat.   Respiratory:  Positive for cough.   Gastrointestinal:  Positive for nausea.     Physical Exam Triage Vital Signs ED Triage Vitals  Enc Vitals Group     BP 11/28/22 1249 133/89     Pulse Rate 11/28/22 1249 88     Resp 11/28/22 1249 16     Temp 11/28/22 1249 98 F (36.7 C)     Temp Source 11/28/22 1249 Oral     SpO2 11/28/22 1249 99 %     Weight --      Height --      Head Circumference --      Peak Flow --      Pain Score 11/28/22 1252 0     Pain Loc --      Pain Edu? --      Excl. in GC? --    No data found.  Updated Vital Signs BP 133/89 (BP Location: Right Arm)  Pulse 88   Temp 98 F (36.7 C) (Oral)   Resp 16   SpO2 99%   Visual Acuity Right Eye Distance:   Left Eye Distance:   Bilateral Distance:    Right Eye Near:   Left Eye Near:    Bilateral Near:     Physical Exam Vitals and nursing note reviewed.  Constitutional:      General: She is not in acute distress.    Appearance: She is well-developed. She is not ill-appearing.  HENT:     Head: Normocephalic and atraumatic.     Right Ear: Tympanic membrane and ear canal normal.     Left Ear: Tympanic membrane and ear canal normal.     Nose: Congestion present.     Mouth/Throat:     Mouth: Mucous membranes are moist.     Pharynx: Oropharynx is clear. Uvula midline. Posterior oropharyngeal erythema present.     Tonsils: No tonsillar exudate or tonsillar abscesses.  Eyes:     Conjunctiva/sclera: Conjunctivae normal.     Pupils: Pupils are equal, round, and reactive to light.  Cardiovascular:     Rate and Rhythm: Normal rate and regular  rhythm.     Heart sounds: Normal heart sounds.  Pulmonary:     Effort: Pulmonary effort is normal.     Breath sounds: Normal breath sounds.  Musculoskeletal:     Cervical back: Normal range of motion and neck supple.  Lymphadenopathy:     Cervical: No cervical adenopathy.  Skin:    General: Skin is warm and dry.  Neurological:     General: No focal deficit present.     Mental Status: She is alert and oriented to person, place, and time.  Psychiatric:        Mood and Affect: Mood normal.        Behavior: Behavior normal.      UC Treatments / Results  Labs (all labs ordered are listed, but only abnormal results are displayed) Labs Reviewed  SARS CORONAVIRUS 2 (TAT 6-24 HRS)  CULTURE, GROUP A STREP [facility])  POCT RAPID STREP A (OFFICE)    EKG   Radiology No results found.  Procedures Procedures (including critical care time)  Medications Ordered in UC Medications - No data to display  Initial Impression / Assessment and Plan / UC Course  I have reviewed the triage vital signs and the nursing notes.  Pertinent labs & imaging results that were available during my care of the patient were reviewed by me and considered in my medical decision making (see chart for details).     Reviewed exam and symptoms with patient.  No red flags.  Negative rapid strep, will culture.  COVID PCR and will contact if positive.  Discussed viral illness and symptomatic treatment Promethazine DM as needed for cough.  Side effect profile reviewed Zofran as needed for nausea Ibuprofen as needed for sore throat/reported fevers Rest and fluids PCP follow-up if symptoms do not improve ER precautions reviewed and patient verbalized understanding Final Clinical Impressions(s) / UC Diagnoses   Final diagnoses:  Sore throat  Viral upper respiratory illness  Nausea     Discharge Instructions      Strep test was negative.  The clinical send your throat swab and your COVID test to the lab  and contact you if these are positive.  Start Promethazine DM as needed for cough.  Please of this medication can make you drowsy.  Do not drink alcohol or drive on this  medication You may take Zofran as needed for nausea/vomiting every 8 hours Ibuprofen as needed for fever/throat pain Rest and fluids Please follow-up with your PCP if your symptoms do not improve Please go to the emergency room if you develop any worsening symptoms Hope you feel better soon!     ED Prescriptions     Medication Sig Dispense Auth. Provider   promethazine-dextromethorphan (PROMETHAZINE-DM) 6.25-15 MG/5ML syrup Take 5 mLs by mouth 4 (four) times daily as needed for cough. 118 mL Radford Pax, NP   ibuprofen (ADVIL) 600 MG tablet Take 1 tablet (600 mg total) by mouth every 6 (six) hours as needed for fever, moderate pain or mild pain. 15 tablet Radford Pax, NP   ondansetron (ZOFRAN-ODT) 4 MG disintegrating tablet Take 1 tablet (4 mg total) by mouth every 8 (eight) hours as needed for nausea or vomiting. 10 tablet Radford Pax, NP      PDMP not reviewed this encounter.   Radford Pax, NP 11/28/22 1330

## 2022-11-28 NOTE — ED Triage Notes (Signed)
Pt presents to UC w/ c/o productive cough, fever, nausea x2 days.Pt took theraflu which helps itchy throat.

## 2022-11-28 NOTE — Discharge Instructions (Signed)
Strep test was negative.  The clinical send your throat swab and your COVID test to the lab and contact you if these are positive.  Start Promethazine DM as needed for cough.  Please of this medication can make you drowsy.  Do not drink alcohol or drive on this medication You may take Zofran as needed for nausea/vomiting every 8 hours Ibuprofen as needed for fever/throat pain Rest and fluids Please follow-up with your PCP if your symptoms do not improve Please go to the emergency room if you develop any worsening symptoms Hope you feel better soon!

## 2022-11-29 LAB — CULTURE, GROUP A STREP (THRC)

## 2022-11-29 LAB — SARS CORONAVIRUS 2 (TAT 6-24 HRS): SARS Coronavirus 2: POSITIVE — AB

## 2022-11-30 LAB — CULTURE, GROUP A STREP (THRC)

## 2022-12-01 LAB — CULTURE, GROUP A STREP (THRC)

## 2022-12-03 ENCOUNTER — Ambulatory Visit: Admission: EM | Admit: 2022-12-03 | Discharge: 2022-12-03 | Disposition: A | Discharge status: Home / Self Care

## 2022-12-03 DIAGNOSIS — U071 COVID-19: Secondary | ICD-10-CM

## 2022-12-03 NOTE — Discharge Instructions (Signed)
Continue Flonase and your cough medicine as needed.  Lots of rest and fluids.  Please follow-up with your PCP if your symptoms do not improve.  Please go to the emergency room if you develop any worsening symptoms.  I hope you feel better soon!

## 2022-12-03 NOTE — ED Provider Notes (Signed)
[facility]    CSN: [id number] Arrival date & time: 12/03/22  1302      History   Chief Complaint Chief Complaint  Patient presents with   Letter for School/Work    HPI Carolyn [name] is a 47 y.o. female  presents for evaluation of URI symptoms for 7 days. Patient reports associated symptoms of cough, congestion, ear pain. Denies N/V/D, fevers, sore throat, body aches, shortness of breath. Patient does not have a hx of asthma or smoking.  Patient was seen in urgent care on 6/19 and did have a positive COVID test the next day.  She was prescribed Promethazine DM and ibuprofen.  She returns today stating that her symptoms are better but not completely resolved and she needs additional days off work.  She was initially given a 3-day work note states she go back on the 22nd.  She reports she was not able to go back that day or today but would like to return tomorrow.  Pt has no other concerns at this time.   HPI  Past Medical History:  Diagnosis Date   HA (headache)     Patient Active Problem List   Diagnosis Date Noted   Migraine 12/18/2013   HA (headache)     Past Surgical History:  Procedure Laterality Date   LAPAROSCOPIC TOTAL HYSTERECTOMY  8/ 2016   TYMPANOPLASTY Left     OB History   No obstetric history on file.      Home Medications    Prior to Admission medications   Medication Sig Start Date End Date Taking? Authorizing Provider  Fremanezumab-vfrm (AJOVY) 225 MG/1.5ML SOAJ Inject 225 mg into the skin every 30 (thirty) days. 03/14/22   Glean Salvo, NP  ibuprofen (ADVIL) 600 MG tablet Take 1 tablet (600 mg total) by mouth every 6 (six) hours as needed for fever, moderate pain or mild pain. 11/28/22   Radford Pax, NP  ondansetron (ZOFRAN-ODT) 4 MG disintegrating tablet Take 1 tablet (4 mg total) by mouth every 8 (eight) hours as needed for nausea or vomiting. 11/28/22   Radford Pax, NP  promethazine-dextromethorphan (PROMETHAZINE-DM) 6.25-15 MG/5ML  syrup Take 5 mLs by mouth 4 (four) times daily as needed for cough. 11/28/22   Radford Pax, NP  SUMAtriptan (IMITREX) 100 MG tablet Take 1 tablet (100 mg total) by mouth every 2 (two) hours as needed for migraine. May repeat in 2 hours if headache persists or recurs. 03/14/22   Glean Salvo, NP    Family History No family history on file.  Social History Social History   Tobacco Use   Smoking status: Never   Smokeless tobacco: Never  Vaping Use   Vaping Use: Never used  Substance Use Topics   Alcohol use: No   Drug use: No     Allergies   Naproxen, Relpax  [eletriptan hydrobromide], and Topamax [topiramate]   Review of Systems Review of Systems  HENT:  Positive for congestion.   Respiratory:  Positive for cough.      Physical Exam Triage Vital Signs ED Triage Vitals  Enc Vitals Group     BP 12/03/22 1318 (!) 157/97     Pulse Rate 12/03/22 1318 (!) 101     Resp 12/03/22 1318 18     Temp 12/03/22 1318 98.1 F (36.7 C)     Temp Source 12/03/22 1318 Oral     SpO2 12/03/22 1330 97 %     Weight --  Height --      Head Circumference --      Peak Flow --      Pain Score 12/03/22 1315 0     Pain Loc --      Pain Edu? --      Excl. in GC? --    No data found.  Updated Vital Signs BP (!) 157/97 (BP Location: Right Arm)   Pulse (!) 101   Temp 98.1 F (36.7 C) (Oral)   Resp 18   SpO2 97%   Visual Acuity Right Eye Distance:   Left Eye Distance:   Bilateral Distance:    Right Eye Near:   Left Eye Near:    Bilateral Near:     Physical Exam Vitals and nursing note reviewed.  Constitutional:      General: She is not in acute distress.    Appearance: She is well-developed. She is not ill-appearing.  HENT:     Head: Normocephalic and atraumatic.     Right Ear: Tympanic membrane and ear canal normal.     Left Ear: Tympanic membrane and ear canal normal.     Nose: Congestion present.     Mouth/Throat:     Mouth: Mucous membranes are moist.      Pharynx: Oropharynx is clear. Uvula midline. No posterior oropharyngeal erythema.     Tonsils: No tonsillar exudate or tonsillar abscesses.  Eyes:     Conjunctiva/sclera: Conjunctivae normal.     Pupils: Pupils are equal, round, and reactive to light.  Cardiovascular:     Rate and Rhythm: Normal rate and regular rhythm.     Heart sounds: Normal heart sounds.  Pulmonary:     Effort: Pulmonary effort is normal.     Breath sounds: Normal breath sounds.  Musculoskeletal:     Cervical back: Normal range of motion and neck supple.  Lymphadenopathy:     Cervical: No cervical adenopathy.  Skin:    General: Skin is warm and dry.  Neurological:     General: No focal deficit present.     Mental Status: She is alert and oriented to person, place, and time.  Psychiatric:        Mood and Affect: Mood normal.        Behavior: Behavior normal.      UC Treatments / Results  Labs (all labs ordered are listed, but only abnormal results are displayed) Labs Reviewed - No data to display  EKG   Radiology No results found.  Procedures Procedures (including critical care time)  Medications Ordered in UC Medications - No data to display  Initial Impression / Assessment and Plan / UC Course  I have reviewed the triage vital signs and the nursing notes.  Pertinent labs & imaging results that were available during my care of the patient were reviewed by me and considered in my medical decision making (see chart for details).     Reviewed exam and symptoms with patient.  No red flags.  Patient with positive COVID test on 6/19 with improving symptoms.  No shortness of breath.  Work note provided for the next 2 days.  Advised to continue cough medicine, rest and fluids.  PCP follow-up if symptoms do not improve.  ER precautions reviewed and patient verbalized understanding. Final Clinical Impressions(s) / UC Diagnoses   Final diagnoses:  COVID-19     Discharge Instructions       Continue Flonase and your cough medicine as needed.  Lots of rest and fluids.  Please follow-up with your PCP if your symptoms do not improve.  Please go to the emergency room if you develop any worsening symptoms.  I hope you feel better soon!   ED Prescriptions   None    PDMP not reviewed this encounter.   Radford Pax, NP 12/03/22 1341

## 2022-12-03 NOTE — ED Triage Notes (Signed)
Pt tested positive for Covid on 6/19. Received work note x3 days. Does not feel back to baseline at this time. Came on Saturday for note for Saturday, but only received copy of the same work note. Would like note for Saturday and today.

## 2022-12-25 ENCOUNTER — Encounter: Payer: Self-pay | Admitting: Neurology

## 2022-12-25 ENCOUNTER — Ambulatory Visit: Admitting: Neurology

## 2022-12-25 DIAGNOSIS — R110 Nausea: Secondary | ICD-10-CM

## 2022-12-25 MED ORDER — AJOVY 225 MG/1.5ML ~~LOC~~ SOAJ: 225.0000 mg | SUBCUTANEOUS | 11 refills | Status: DC

## 2022-12-25 MED ORDER — AJOVY 225 MG/1.5ML ~~LOC~~ SOAJ: 1.0000 | SUBCUTANEOUS | Status: DC

## 2022-12-25 MED ORDER — SUMATRIPTAN SUCCINATE 100 MG PO TABS: 100.0000 mg | ORAL_TABLET | ORAL | 11 refills | Status: DC | PRN

## 2022-12-25 MED ORDER — ONDANSETRON 4 MG PO TBDP: 4.0000 mg | ORAL_TABLET | Freq: Three times a day (TID) | ORAL | 3 refills | Status: DC | PRN

## 2022-12-25 NOTE — Addendum Note (Signed)
Addended by: Eather Colas E on: 12/25/2022 01:14 PM   Modules accepted: Orders

## 2022-12-25 NOTE — Patient Instructions (Signed)
Start Ajovy once a month injection for migraine prevention  Continue Imitrex as needed Call me if you have issues with filling the Ajovy! Thanks!!

## 2022-12-25 NOTE — Progress Notes (Signed)
PATIENT: Carolyn [name] DOB: 1976/05/17  REASON FOR VISIT: follow up HISTORY FROM: patient PRIMARY NEUROLOGIST: Terrace Arabia  ASSESSMENT AND PLAN  1.  Chronic migraine headache -Reorder Ajovy 225 mg once a month injection for migraine prevention, given sample today at office  -Continue Imitrex 100 mg as needed for acute headache, may combine with Zofran as needed -Previously tried and failed: Nortriptyline, Effexor, Topamax, Maxalt -Next steps: Emgality, Qulipta -I will complete FMLA for her when she is eligible in October, she can bring in the paperwork -Call for dose adjustment, otherwise follow-up in 6 months  Meds ordered this encounter  Medications   Fremanezumab-vfrm (AJOVY) 225 MG/1.5ML SOAJ    Sig: Inject 225 mg into the skin every 30 (thirty) days.    Dispense:  1.68 mL    Refill:  11   SUMAtriptan (IMITREX) 100 MG tablet    Sig: Take 1 tablet (100 mg total) by mouth every 2 (two) hours as needed for migraine. May repeat in 2 hours if headache persists or recurs.    Dispense:  10 tablet    Refill:  11   ondansetron (ZOFRAN-ODT) 4 MG disintegrating tablet    Sig: Take 1 tablet (4 mg total) by mouth every 8 (eight) hours as needed for nausea or vomiting.    Dispense:  20 tablet    Refill:  3   HISTORYMerlita [name] is a 47 years old right-handed Asian female, referred by her primary care physician for evaluation of frequent headaches, She reported a history of migraine headaches since high school, getting worse over the past one year, to almost daily basis, her headache usually triggered by hungry, and Eye-Strain, her typical migraine it is lateralized severe pounding headaches with associated light, noise sensitivity, lasting couple hours, sometimes of days, Over the past one year, she has self medicated herself with almost daily ibuprofen, naproxen, has developed a rash, She presented to the emergency room 2 weeks ago following one episode of severe headaches, was given  prescription of Maxalt, last night, she tried it for the first time, which has been very helpful, there was no significant side effect UPDATE December 20 2014: She reported that nortriptyline has been very helpful for her migraine headaches, but she is no longer taking it, she complains of mild GI side effect at nighttime, but she also has the habit of eating before going to sleep, she is taking Maxalt as needed, help for most of the time, but sometimes, she has headaches back-to-back, severe nausea, vomiting, repeat dose of Maxalt does not take away the headaches, she continue have almost daily headaches for 2 weeks now, She is planning on to hysterectomy for fibroids in August 2016, is currently receiving Depo shots     UPDATE 03/20/17[name], 47 year old female returns for follow-up. She had a hysterectomy for fibroids in August she is no longer taking her Depo shots. Her headaches are much improved. She has stopped her  nortriptyline 10 mg at night. She takes Maxalt acutely. She has not had to take the Imitrex injections. She has prescription glasses . She is watching her  food triggers. She now has a rare migraine relieved with Maxalt. She returns for reevaluation  UPDATE 06/22/2018[name], 47 year old female returns for follow-up with history of migraine headaches. She was on a preventative nortriptyline. Medication. She does not have migraines to warrant a preventative at this time. She remains on Maxalt but complains of drowsiness. She does not want to take sumatriptan injection. Heat  is  a big migraine trigger for her and she about tries to avoid her food triggers as well. She returns for reevaluation UPDATE 6/24/2019[name], 47 year old female returns for follow-up with history of migraine headaches.  She has 1-2 headaches per month which does not warrant a preventive medication.  She has been on nortriptyline in the past.  Heat is a trigger along with certain foods.  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  She returns for reevaluation   Update December 04, 2018 [name], history of migraine headaches.  She is not currently on preventative medication. For the past several months, she has had almost daily headache, attributes this to stress at work.  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  She describes her typical headache as left frontal, retro-orbital, photophobia, nausea/vomiting, sensitive to smells.  In the past she tried nortriptyline, but had side effect.  She describes this as her typical migraine.  She has been having daily Maxalt.    She had MRI of the brain in June 2019 at [facility] (found in Care Everywhere).  No acute intracranial abnormality, minimal ethmoid sinus disease  Update April 20, 2020 SS: At last visit, was placed on Effexor for headache, reported feeling emotional, jaw pain, itching, was stopped.  Switch to Topamax working up to 75 mg at bedtime, but never started.  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  Taking 1/2-1 tablet Maxalt 4-5 times a week, or BC powders.  Headaches are bilateral temples, throbbing, photophobia, phonophobia, nausea.  Has not had severe headache associated with vomiting as before.   Update March 14, 2022 SS: Missed last appointment, went to [place] 3 months, her dad passed away from prostate cancer. 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 Increase in migraines, 2-3 a week. Takes Maxalt, it helps but she vomits it up, feels drowsy. Zofran sometimes doesn't help. No longer on Topamax, it caused GI upset.   Update December 25, 2022 SS: Didn't get the Ajovy, claims her insurance wouldn't pay, I don't see any messages. Works in Surveyor, mining. Would like to get FMLA, but not at 1 year yet. 12 migraines a month. Takes Imitrex with good benefit if she  doesn't vomit, has zofran PRN.   REVIEW OF SYSTEMS: Out of a complete 14 system review of symptoms, the patient complains only of the following symptoms, and all other reviewed systems are negative.  Headache   ALLERGIES: Allergies  Allergen Reactions   Naproxen     rash   Relpax  [Eletriptan Hydrobromide]    Topamax [Topiramate]     Higher dose felt 50mg  ( reddened skin, anxiety, itch    HOME MEDICATIONS: Outpatient Medications Prior to Visit  Medication Sig Dispense Refill   ibuprofen (ADVIL) 600 MG tablet Take 1 tablet (600 mg total) by mouth every 6 (six) hours as needed for fever, moderate pain or mild pain. 15 tablet 0   promethazine-dextromethorphan (PROMETHAZINE-DM) 6.25-15 MG/5ML syrup Take 5 mLs by mouth 4 (four) times daily as needed for cough. 118 mL 0   Fremanezumab-vfrm (AJOVY) 225 MG/1.5ML SOAJ Inject 225 mg into the skin every 30 (thirty) days. 1.68 mL 11   ondansetron (ZOFRAN-ODT) 4 MG disintegrating tablet Take 1 tablet (4 mg total) by mouth every 8 (eight) hours as needed for nausea or vomiting. 10 tablet 0  SUMAtriptan (IMITREX) 100 MG tablet Take 1 tablet (100 mg total) by mouth every 2 (two) hours as needed for migraine. May repeat in 2 hours if headache persists or recurs. 10 tablet 11   No facility-administered medications prior to visit.    PAST MEDICAL HISTORY: Past Medical History:  Diagnosis Date   HA (headache)     PAST SURGICAL HISTORY: Past Surgical History:  Procedure Laterality Date   LAPAROSCOPIC TOTAL HYSTERECTOMY  8/ 2016   TYMPANOPLASTY Left     FAMILY HISTORY: History reviewed. No pertinent family history.  SOCIAL HISTORY: Social History   Socioeconomic History   Marital status: Married    Spouse name: Ramon Dredge   Number of children: 0   Years of education: college   Highest education level: Not on file  Occupational History    Comment: [employer]  Tobacco Use   Smoking status: Never   Smokeless tobacco: Never  Vaping Use    Vaping status: Never Used  Substance and Sexual Activity   Alcohol use: No   Drug use: No   Sexual activity: Yes    Birth control/protection: None  Other Topics Concern   Not on file  Social History Narrative   Patient lives at home with her husband.Ramon Dredge). Patient works for [employer] full time.   Education college.   Right handed.   Caffeine None.         Social Determinants of Health   Financial Resource Strain: Not on file  Food Insecurity: Not on file  Transportation Needs: Not on file  Physical Activity: Not on file  Stress: Not on file  Social Connections: Unknown (10/22/2021)   Received from [facility]   Social Network    Social Network: Not on file  Intimate Partner Violence: Unknown (09/13/2021)   Received from [facility]   HITS    Physically Hurt: Not on file    Insult or Talk Down To: Not on file    Threaten Physical Harm: Not on file    Scream or Curse: Not on file   PHYSICAL EXAM  Vitals:   12/25/22 1240  BP: (!) 139/97  Pulse: (!) 55  Weight: 119 lb (54 kg)  Height: 4\' 11"  (1.499 m)    Body mass index is 24.04 kg/m.  Generalized: Well developed, in no acute distress   Neurological examination  Mentation: Alert oriented to time, place, history taking. Follows all commands speech and language fluent Cranial nerve II-XII: Pupils were equal round reactive to light. Extraocular movements were full, visual field were full on confrontational test. Facial sensation and strength were normal. Head turning and shoulder shrug  were normal and symmetric. Motor: The motor testing reveals 5 over 5 strength of all 4 extremities. Good symmetric motor tone is noted throughout.  Sensory: Sensory testing is intact to soft touch on all 4 extremities. No evidence of extinction is noted.  Coordination: Cerebellar testing reveals good finger-nose-finger and heel-to-shin bilaterally.  Gait and station: Gait is normal.   Reflexes: Deep tendon reflexes are  symmetric and normal bilaterally.   DIAGNOSTIC DATA (LABS, IMAGING, TESTING) - I reviewed patient records, labs, notes, testing and imaging myself where available.  No results found for: "WBC", "HGB", "HCT", "MCV", "PLT" No results found for: "NA", "K", "CL", "CO2", "GLUCOSE", "BUN", "CREATININE", "CALCIUM", "PROT", "ALBUMIN", "AST", "ALT", "ALKPHOS", "BILITOT", "GFRNONAA", "GFRAA" No results found for: "CHOL", "HDL", "LDLCALC", "LDLDIRECT", "TRIG", "CHOLHDL" No results found for: "HGBA1C" No results found for: "VITAMINB12" No results found for: "TSH"  Margie Ege, AGNP-C, DNP 12/25/2022, 1:06 PM [facility] [address] [place], [place] [address] [phone]

## 2022-12-31 ENCOUNTER — Telehealth: Payer: Self-pay

## 2022-12-31 ENCOUNTER — Encounter: Payer: Self-pay | Admitting: Neurology

## 2022-12-31 ENCOUNTER — Other Ambulatory Visit (HOSPITAL_COMMUNITY): Payer: Self-pay

## 2022-12-31 ENCOUNTER — Telehealth: Payer: Self-pay | Admitting: Neurology

## 2022-12-31 MED ORDER — EMGALITY 120 MG/ML ~~LOC~~ SOAJ: 120.0000 mg | SUBCUTANEOUS | 11 refills | Status: DC

## 2022-12-31 NOTE — Telephone Encounter (Signed)
Pt is asking for a call to discuss being told that a PA is needed for Ajovy , and to discuss pt trying Emgality.

## 2022-12-31 NOTE — Telephone Encounter (Signed)
LMTCB 1ST ATTEMPT

## 2022-12-31 NOTE — Telephone Encounter (Signed)
Pt returned call and I explained that we are working on pa for [employer]. Pt wanted to know difference between ajovy and emgality. I said the main difference is loading dose. She stated that she is agreeable to ajovy as that is the one ms sarah chose she just wanted to understand the difference.

## 2022-12-31 NOTE — Telephone Encounter (Signed)
   Per test claim insurance requires PT to try Emgality. Please advise-

## 2022-12-31 NOTE — Telephone Encounter (Signed)
Pt must try and fail emgality 1st

## 2022-12-31 NOTE — Telephone Encounter (Signed)
She got sample of Ajovy in office. Next month can fill the Emgality and stop the Ajovy. Thanks  Meds ordered this encounter  Medications   Galcanezumab-gnlm (EMGALITY) 120 MG/ML SOAJ    Sig: Inject 120 mg into the skin every 30 (thirty) days.    Dispense:  1.12 mL    Refill:  11

## 2022-12-31 NOTE — Telephone Encounter (Signed)
1st attempt lmtcb will also route to pa team to get pa started

## 2023-01-01 NOTE — Telephone Encounter (Signed)
3rd attempt lvm

## 2023-01-01 NOTE — Telephone Encounter (Signed)
Called and relayed message to pt. She was agreeable and states she will reach out if she has trouble getting medication

## 2023-01-01 NOTE — Telephone Encounter (Signed)
2nd attempt-lvm °

## 2023-01-23 NOTE — Telephone Encounter (Signed)
error 

## 2023-07-09 ENCOUNTER — Ambulatory Visit: Admitting: Neurology

## 2023-07-09 ENCOUNTER — Encounter: Payer: Self-pay | Admitting: Neurology

## 2023-07-09 DIAGNOSIS — R110 Nausea: Secondary | ICD-10-CM

## 2023-07-09 MED ORDER — ONDANSETRON 4 MG PO TBDP: 4.0000 mg | ORAL_TABLET | Freq: Three times a day (TID) | ORAL | 3 refills | Status: AC | PRN

## 2023-07-09 MED ORDER — SUMATRIPTAN SUCCINATE 100 MG PO TABS: 100.0000 mg | ORAL_TABLET | ORAL | 11 refills | Status: DC | PRN

## 2023-07-09 MED ORDER — EMGALITY 120 MG/ML ~~LOC~~ SOAJ: 120.0000 mg | SUBCUTANEOUS | 11 refills | Status: DC

## 2023-07-09 NOTE — Progress Notes (Signed)
PATIENT: Carolyn [name] DOB: 17-Jun-1975  REASON FOR VISIT: follow up HISTORY FROM: patient PRIMARY NEUROLOGIST: Terrace Arabia  ASSESSMENT AND PLAN  1.  Chronic migraine headache -Migraines under excellent control! -Continue Emgality 120 mg monthly injection for migraine prevention, monitor for redness to injection site. Try using Benadryl or hydrocortisone cream.  If redness increases, we may switch to Ajovy or try Qulipta. -Continue Imitrex 100 mg as needed for acute headache, may combine with Zofran as needed -Previously tried and failed: Nortriptyline, Effexor, Topamax, Maxalt, Ajovy (too expensive) -Next steps: Emgality, Qulipta -I will complete FMLA for her when she is eligible, she can send to me -Follow-up in 1 year or sooner if needed  HISTORYMerlita [name] is a 48 years old right-handed Asian female, referred by her primary care physician for evaluation of frequent headaches, She reported a history of migraine headaches since high school, getting worse over the past one year, to almost daily basis, her headache usually triggered by hungry, and Eye-Strain, her typical migraine it is lateralized severe pounding headaches with associated light, noise sensitivity, lasting couple hours, sometimes of days, Over the past one year, she has self medicated herself with almost daily ibuprofen, naproxen, has developed a rash, She presented to the emergency room 2 weeks ago following one episode of severe headaches, was given prescription of Maxalt, last night, she tried it for the first time, which has been very helpful, there was no significant side effect UPDATE December 20 2014: She reported that nortriptyline has been very helpful for her migraine headaches, but she is no longer taking it, she complains of mild GI side effect at nighttime, but she also has the habit of eating before going to sleep, she is taking Maxalt as needed, help for most of the time, but sometimes, she has headaches back-to-back,  severe nausea, vomiting, repeat dose of Maxalt does not take away the headaches, she continue have almost daily headaches for 2 weeks now, She is planning on to hysterectomy for fibroids in August 2016, is currently receiving Depo shots     UPDATE 03/20/17[name], 48 year old female returns for follow-up. She had a hysterectomy for fibroids in August she is no longer taking her Depo shots. Her headaches are much improved. She has stopped her  nortriptyline 10 mg at night. She takes Maxalt acutely. She has not had to take the Imitrex injections. She has prescription glasses . She is watching her  food triggers. She now has a rare migraine relieved with Maxalt. She returns for reevaluation  UPDATE 06/22/2018[name], 48 year old female returns for follow-up with history of migraine headaches. She was on a preventative nortriptyline. Medication. She does not have migraines to warrant a preventative at this time. She remains on Maxalt but complains of drowsiness. She does not want to take sumatriptan injection. Heat  is a big migraine trigger for her and she about tries to avoid her food triggers as well. She returns for reevaluation UPDATE 6/24/2019[name], 48 year old female returns for follow-up with history of migraine headaches.  She has 1-2 headaches per month which does not warrant a preventive medication.  She has been on nortriptyline in the past.  Heat is a trigger along with certain foods.  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  She returns for reevaluation   Update December 04, 2018 SS: 48 year old female, history of migraine headaches.  She is not currently on preventative medication. For the past several months, she has had almost daily headache, attributes  this to stress at work.  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  She describes her typical headache as left frontal, retro-orbital, photophobia, nausea/vomiting, sensitive to  smells.  In the past she tried nortriptyline, but had side effect.  She describes this as her typical migraine.  She has been having daily Maxalt.    She had MRI of the brain in June 2019 at [facility] (found in Care Everywhere).  No acute intracranial abnormality, minimal ethmoid sinus disease  Update April 20, 2020 SS: At last visit, was placed on Effexor for headache, reported feeling emotional, jaw pain, itching, was stopped.  Switch to Topamax working up to 75 mg at bedtime, but never started.  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  Taking 1/2-1 tablet Maxalt 4-5 times a week, or BC powders.  Headaches are bilateral temples, throbbing, photophobia, phonophobia, nausea.  Has not had severe headache associated with vomiting as before.   Update March 14, 2022 SS: Carolyn [name], went to [place] 3 months, her dad passed away from prostate cancer. 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 Increase in migraines, 2-3 a week. Takes Maxalt, it helps but she vomits it up, feels drowsy. Zofran sometimes doesn't help. No longer on Topamax, it caused GI upset.   Update December 25, 2022 SS: Didn't get the Ajovy, claims her insurance wouldn't pay, I don't see any messages. Works in Surveyor, mining. Would like to get FMLA, but not at 1 year yet. 12 migraines a month. Takes Imitrex with good benefit if she doesn't vomit, has zofran PRN.   Update July 09, 2023 SS: Migraines are better. Ajovy was expensive, Emgality is working well, about 1 headache a month, takes 1/2 Imitrex 100 mg once a month. Works Firefighter. Does report after Emgality some redness to injection site.  Resolves after a few days.  REVIEW OF SYSTEMS: Out of a complete 14 system review of symptoms, the patient complains only of the following symptoms, and all other reviewed systems are negative.  Headache    ALLERGIES: Allergies  Allergen Reactions   Naproxen     rash   Relpax  [Eletriptan Hydrobromide]    Topamax [Topiramate]     Higher dose felt 50mg  ( reddened skin, anxiety, itch    HOME MEDICATIONS: Outpatient Medications Prior to Visit  Medication Sig Dispense Refill   Dupilumab 300 MG/2ML SOAJ Inject into the skin.     estradiol (VIVELLE-DOT) 0.1 MG/24HR patch 1 patch 2 (two) times a week.     Galcanezumab-gnlm (EMGALITY) 120 MG/ML SOAJ Inject 120 mg into the skin every 30 (thirty) days. 1.12 mL 11   ondansetron (ZOFRAN-ODT) 4 MG disintegrating tablet Take 1 tablet (4 mg total) by mouth every 8 (eight) hours as needed for nausea or vomiting. 20 tablet 3   SUMAtriptan (IMITREX) 100 MG tablet Take 1 tablet (100 mg total) by mouth every 2 (two) hours as needed for migraine. May repeat in 2 hours if headache persists or recurs. 10 tablet 11   Fremanezumab-vfrm (AJOVY) 225 MG/1.5ML SOAJ Inject 225 mg into the skin every 30 (thirty) days. (Patient not taking: Reported on 07/09/2023) 1.68 mL 11   ibuprofen (ADVIL) 600 MG tablet Take 1 tablet (600 mg total) by mouth every 6 (six) hours as needed for fever, moderate pain or mild pain. (Patient not taking: Reported on 07/09/2023) 15  tablet 0   promethazine-dextromethorphan (PROMETHAZINE-DM) 6.25-15 MG/5ML syrup Take 5 mLs by mouth 4 (four) times daily as needed for cough. (Patient not taking: Reported on 07/09/2023) 118 mL 0   No facility-administered medications prior to visit.    PAST MEDICAL HISTORY: Past Medical History:  Diagnosis Date   HA (headache)     PAST SURGICAL HISTORY: Past Surgical History:  Procedure Laterality Date   LAPAROSCOPIC TOTAL HYSTERECTOMY  8/ 2016   TYMPANOPLASTY Left     FAMILY HISTORY: History reviewed. No pertinent family history.  SOCIAL HISTORY: Social History   Socioeconomic History   Marital status: Married    Spouse name: Ramon Dredge   Number of children: 0   Years of education: college    Highest education level: Not on file  Occupational History    Comment: [employer]  Tobacco Use   Smoking status: Never   Smokeless tobacco: Never  Vaping Use   Vaping status: Never Used  Substance and Sexual Activity   Alcohol use: No   Drug use: No   Sexual activity: Yes    Birth control/protection: None  Other Topics Concern   Not on file  Social History Narrative   Patient lives at home with her husband.Ramon Dredge). Patient works for [name] time.   Education college.   Right handed.   Caffeine None.         Social Drivers of Corporate investment banker Strain: Not on file  Food Insecurity: Not on file  Transportation Needs: Not on file  Physical Activity: Not on file  Stress: Not on file  Social Connections: Unknown (10/22/2021)   [facility]   Social Network    Social Network: Not on file  Intimate Partner Violence: Unknown (09/13/2021)   [facility] from [facility]   HITS    Physically Hurt: Not on file    Insult or Talk Down To: Not on file    Threaten Physical Harm: Not on file    Scream or Curse: Not on file   PHYSICAL EXAM  Vitals:   07/09/23 1055  BP: (!) 140/90  Pulse: 87  Weight: 121 lb (54.9 kg)  Height: 4\' 11"  (1.499 m)   Body mass index is 24.44 kg/m.  Generalized: Well developed, in no acute distress   Neurological examination  Mentation: Alert oriented to time, place, history taking. Follows all commands speech and language fluent Cranial nerve II-XII: Pupils were equal round reactive to light. Extraocular movements were [name], visual field were [name] on confrontational test. Facial sensation and strength were normal. Head turning and shoulder shrug  were normal and symmetric. Motor: The motor testing reveals 5 over 5 strength of all 4 extremities. Good symmetric motor tone is noted throughout.  Sensory: Sensory testing is intact to soft touch on all 4 extremities. No evidence of extinction  is noted.  Coordination: Cerebellar testing reveals good finger-nose-finger and heel-to-shin bilaterally.  Gait and station: Gait is normal.    DIAGNOSTIC DATA (LABS, IMAGING, TESTING) - I reviewed patient records, labs, notes, testing and imaging myself where available.  No results found for: "WBC", "HGB", "HCT", "MCV", "PLT" No results found for: "NA", "K", "CL", "CO2", "GLUCOSE", "BUN", "CREATININE", "CALCIUM", "PROT", "ALBUMIN", "AST", "ALT", "ALKPHOS", "BILITOT", "GFRNONAA", "GFRAA" No results found for: "CHOL", "HDL", "LDLCALC", "LDLDIRECT", "TRIG", "CHOLHDL" No results found for: "HGBA1C" No results found for: "VITAMINB12" No results found for: "TSH"  Margie Ege, AGNP-C, DNP 07/09/2023, 11:04 AM [facility]  [address], [address] [place], [place] [address] [phone]

## 2023-07-09 NOTE — Patient Instructions (Signed)
Great to see you today.  We will continue current medications.  Try using Benadryl or hydrocortisone to injection site of Emgality.  Also try rotating the injection sites.  If redness increases please let me know, we can switch to something else.  Please feel free to reach out.  I will see you back in 1 year.  Thanks!!

## 2023-08-27 ENCOUNTER — Other Ambulatory Visit: Payer: Self-pay

## 2023-08-27 MED ORDER — EMGALITY 120 MG/ML ~~LOC~~ SOAJ: 120.0000 mg | SUBCUTANEOUS | 11 refills | Status: DC

## 2024-03-24 ENCOUNTER — Other Ambulatory Visit: Payer: Self-pay

## 2024-03-24 MED ORDER — EMGALITY 120 MG/ML ~~LOC~~ SOAJ: 120.0000 mg | SUBCUTANEOUS | 11 refills | Status: DC

## 2024-05-15 ENCOUNTER — Ambulatory Visit
Admission: EM | Admit: 2024-05-15 | Discharge: 2024-05-15 | Disposition: A | Discharge status: Home / Self Care | Payer: Self-pay | Attending: Family Medicine | Admitting: Family Medicine

## 2024-05-15 DIAGNOSIS — J988 Other specified respiratory disorders: Secondary | ICD-10-CM | POA: Diagnosis not present

## 2024-05-15 DIAGNOSIS — B9789 Other viral agents as the cause of diseases classified elsewhere: Secondary | ICD-10-CM

## 2024-05-15 LAB — POC COVID19/FLU A&B COMBO
Covid Antigen, POC: NEGATIVE
Influenza A Antigen, POC: NEGATIVE
Influenza B Antigen, POC: NEGATIVE

## 2024-05-15 MED ORDER — PROMETHAZINE-DM 6.25-15 MG/5ML PO SYRP: 5.0000 mL | ORAL_SOLUTION | Freq: Every evening | ORAL | 0 refills | Status: AC | PRN

## 2024-05-15 MED ORDER — PSEUDOEPHEDRINE HCL 30 MG PO TABS: 30.0000 mg | ORAL_TABLET | Freq: Three times a day (TID) | ORAL | 0 refills | Status: AC | PRN

## 2024-05-15 MED ORDER — CETIRIZINE HCL 10 MG PO TABS: 10.0000 mg | ORAL_TABLET | Freq: Every day | ORAL | 0 refills | Status: AC

## 2024-05-15 NOTE — ED Triage Notes (Signed)
 Pt reports headache, nasal congestion since last night. Nyquil and Dayquil gives some relief.

## 2024-05-15 NOTE — Discharge Instructions (Addendum)
 We will manage this as a viral respiratory infection. For sore throat or cough try using a honey-based tea. Use 3 teaspoons of honey with juice squeezed from half lemon. Place shaved pieces of ginger into 1/2-1 cup of water and warm over stove top. Then mix the ingredients and repeat every 4 hours as needed. Please take Tylenol 500mg -650mg  once every 6 hours for fevers, aches and pains. Hydrate very well with at least 2 liters (64 ounces) of water. Eat light meals such as soups (chicken and noodles, chicken wild rice, vegetable).  Do not eat any foods that you are allergic to.  Start an antihistamine like Zyrtec (10mg  daily) for postnasal drainage, sinus congestion.  You can take this together with pseudoephedrine (Sudafed) at a dose of 30 mg 3 times a day or twice daily as needed for the same kind of congestion.  Use cough syrup as needed.

## 2024-05-15 NOTE — ED Provider Notes (Signed)
 [facility]  Note:  This document was prepared using Conservation officer, historic buildings and may include unintentional dictation errors.  MRN: [id number] DOB: [birth year]  Subjective:   Anikah Hogge is a 48 y.o. female presenting for 1 day history of sinus congestion, subjective fever, watery runny nose, coughing. No chest pain, shob, wheezing, n/v, abdominal pain. No asthma. No smoking of any kind including cigarettes, cigars, vaping, marijuana use.  Would like COVID flu testing.   No current facility-administered medications for this encounter.  Current Outpatient Medications:    Dupilumab 300 MG/2ML SOAJ, Inject into the skin., Disp: , Rfl:    estradiol (VIVELLE-DOT) 0.1 MG/24HR patch, 1 patch 2 (two) times a week., Disp: , Rfl:    Galcanezumab -gnlm (EMGALITY ) 120 MG/ML SOAJ, Inject 120 mg into the skin every 30 (thirty) days., Disp: 1.12 mL, Rfl: 11   ondansetron  (ZOFRAN -ODT) 4 MG disintegrating tablet, Take 1 tablet (4 mg total) by mouth every 8 (eight) hours as needed for nausea or vomiting., Disp: 20 tablet, Rfl: 3   SUMAtriptan  (IMITREX ) 100 MG tablet, Take 1 tablet (100 mg total) by mouth every 2 (two) hours as needed for migraine. May repeat in 2 hours if headache persists or recurs., Disp: 10 tablet, Rfl: 11   Allergies  Allergen Reactions   Relpax  [Eletriptan Hydrobromide]    Topamax  [Topiramate ]     Higher dose felt 50mg  ( reddened skin, anxiety, itch   Naproxen Dermatitis and Other (See Comments)    rash    Past Medical History:  Diagnosis Date   HA (headache)      Past Surgical History:  Procedure Laterality Date   LAPAROSCOPIC TOTAL HYSTERECTOMY  8/ 2016   TYMPANOPLASTY Left     History reviewed. No pertinent family history.  Social History   Tobacco Use   Smoking status: Never   Smokeless tobacco: Never  Vaping Use   Vaping status: Never Used  Substance Use Topics   Alcohol use: No   Drug use: No    ROS   Objective:    Vitals: BP (!) 149/97 (BP Location: Left Arm)   Pulse 90   Temp 98.9 F (37.2 C) (Oral)   Resp 16   SpO2 97%   Physical Exam Constitutional:      General: She is not in acute distress.    Appearance: Normal appearance. She is well-developed and normal weight. She is not ill-appearing, toxic-appearing or diaphoretic.  HENT:     Head: Normocephalic and atraumatic.     Right Ear: Tympanic membrane, ear canal and external ear normal. No drainage or tenderness. No middle ear effusion. There is no impacted cerumen. Tympanic membrane is not erythematous or bulging.     Left Ear: Tympanic membrane, ear canal and external ear normal. No drainage or tenderness.  No middle ear effusion. There is no impacted cerumen. Tympanic membrane is not erythematous or bulging.     Nose: Nose normal. No congestion or rhinorrhea.     Mouth/Throat:     Mouth: Mucous membranes are moist. No oral lesions.     Pharynx: No pharyngeal swelling, oropharyngeal exudate, posterior oropharyngeal erythema or uvula swelling.     Tonsils: No tonsillar exudate or tonsillar abscesses. 0 on the right. 0 on the left.  Eyes:     General: No scleral icterus.       Right eye: No discharge.        Left eye: No discharge.     Extraocular Movements:  Extraocular movements intact.     Right eye: Normal extraocular motion.     Left eye: Normal extraocular motion.     Conjunctiva/sclera: Conjunctivae normal.  Cardiovascular:     Rate and Rhythm: Normal rate and regular rhythm.     Heart sounds: Normal heart sounds. No murmur heard.    No friction rub. No gallop.  Pulmonary:     Effort: Pulmonary effort is normal. No respiratory distress.     Breath sounds: No stridor. No wheezing, rhonchi or rales.  Chest:     Chest wall: No tenderness.  Musculoskeletal:     Cervical back: Normal range of motion and neck supple.  Lymphadenopathy:     Cervical: No cervical adenopathy.  Skin:    General: Skin is warm and dry.   Neurological:     General: No focal deficit present.     Mental Status: She is alert and oriented to person, place, and time.  Psychiatric:        Mood and Affect: Mood normal.        Behavior: Behavior normal.     Results for orders placed or performed during the hospital encounter of 05/15/24 (from the past 24 hours)  POC Covid + Flu A/B Antigen     Status: None   Collection Time: 05/15/24  4:44 PM  Result Value Ref Range   Influenza A Antigen, POC Negative Negative   Influenza B Antigen, POC Negative Negative   Covid Antigen, POC Negative Negative    Assessment and Plan :   PDMP not reviewed this encounter.  1. Viral respiratory infection     Deferred imaging given clear cardiopulmonary exam, hemodynamically stable vital signs. Suspect viral URI, viral syndrome. Physical exam findings reassuring and vital signs stable for discharge. Advised supportive care, offered symptomatic relief. Counseled patient on potential for adverse effects with medications prescribed/recommended today, ER and return-to-clinic precautions discussed, patient verbalized understanding.     Christopher Savannah, [place] 05/15/24 1645

## 2024-07-01 DIAGNOSIS — Z79890 Hormone replacement therapy (postmenopausal): Secondary | ICD-10-CM | POA: Insufficient documentation

## 2024-07-09 ENCOUNTER — Ambulatory Visit: Admitting: Neurology

## 2024-07-09 VITALS — Ht 59.0 in | Wt 117.5 lb

## 2024-07-09 DIAGNOSIS — J329 Chronic sinusitis, unspecified: Secondary | ICD-10-CM | POA: Insufficient documentation

## 2024-07-09 DIAGNOSIS — G43009 Migraine without aura, not intractable, without status migrainosus: Secondary | ICD-10-CM | POA: Diagnosis not present

## 2024-07-09 DIAGNOSIS — L709 Acne, unspecified: Secondary | ICD-10-CM | POA: Insufficient documentation

## 2024-07-09 MED ORDER — AJOVY 225 MG/1.5ML ~~LOC~~ SOAJ: 225.0000 mg | SUBCUTANEOUS | 11 refills | Status: AC

## 2024-07-09 MED ORDER — SUMATRIPTAN SUCCINATE 100 MG PO TABS: 100.0000 mg | ORAL_TABLET | ORAL | 11 refills | Status: AC | PRN

## 2024-07-09 NOTE — Patient Instructions (Signed)
 Stop Emgality , start Ajovy  for migraine prevention  Watch for rash

## 2024-07-09 NOTE — Progress Notes (Addendum)
 "  PATIENT: Carolyn [name] DOB: 05-Dec-1975  REASON FOR VISIT: follow up HISTORY FROM: patient PRIMARY NEUROLOGIST: Onita  ASSESSMENT AND PLAN  1.  Chronic migraine headache -Migraines under excellent control, but developed generalized rash, possibly from Emgality ?  -Switch back to Ajovy , previously migraines well controlled, no rash -Next Steps: Botox, patient worries about Mickel because so many of her migraines result in vomiting, vomit the pills up? -Continue Imitrex  100 mg as needed for acute headache, may combine with Zofran  as needed -Previously tried and failed: Nortriptyline , Effexor , Topamax , Maxalt , Ajovy  (too expensive) -Follow up in 6 months   HISTORYMerlita [name] is a 49 years old right-handed Asian female, referred by her primary care physician for evaluation of frequent headaches, She reported a history of migraine headaches since high school, getting worse over the past one year, to almost daily basis, her headache usually triggered by hungry, and Eye-Strain, her typical migraine it is lateralized severe pounding headaches with associated light, noise sensitivity, lasting couple hours, sometimes of days, Over the past one year, she has self medicated herself with almost daily ibuprofen , naproxen, has developed a rash, She presented to the emergency room 2 weeks ago following one episode of severe headaches, was given prescription of Maxalt , last night, she tried it for the first time, which has been very helpful, there was no significant side effect UPDATE December 20 2014: She reported that nortriptyline  has been very helpful for her migraine headaches, but she is no longer taking it, she complains of mild GI side effect at nighttime, but she also has the habit of eating before going to sleep, she is taking Maxalt  as needed, help for most of the time, but sometimes, she has headaches back-to-back, severe nausea, vomiting, repeat dose of Maxalt  does not take away the headaches, she  continue have almost daily headaches for 2 weeks now, She is planning on to hysterectomy for fibroids in August 2016, is currently receiving Depo shots     UPDATE 03/20/17Ms. Carolyn [name], 49 year old female returns for follow-up. She had a hysterectomy for fibroids in August she is no longer taking her Depo shots. Her headaches are much improved. She has stopped her  nortriptyline  10 mg at night. She takes Maxalt  acutely. She has not had to take the Imitrex  injections. She has prescription glasses . She is watching her  food triggers. She now has a rare migraine relieved with Maxalt . She returns for reevaluation  UPDATE 06/22/2018CM Ms. Carolyn [name], 49 year old female returns for follow-up with history of migraine headaches. She was on a preventative nortriptyline . Medication. She does not have migraines to warrant a preventative at this time. She remains on Maxalt  but complains of drowsiness. She does not want to take sumatriptan  injection. Heat  is a big migraine trigger for her and she about tries to avoid her food triggers as well. She returns for reevaluation UPDATE 6/24/2019CM Ms. Carolyn [name], 49 year old female returns for follow-up with history of migraine headaches.  She has 1-2 headaches per month which does not warrant a preventive medication.  She has been on nortriptyline  in the past.  Heat is a trigger along with certain foods.  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  She returns for reevaluation   Update December 04, 2018 SS: 49 year old female, history of migraine headaches.  She is not currently on preventative medication. For the past several months, she has had almost daily headache, attributes this to stress at work.  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  She describes her typical headache as left frontal, retro-orbital, photophobia, nausea/vomiting, sensitive to smells.  In the past she tried nortriptyline , but had side effect.  She describes this as  her typical migraine.  She has been having daily Maxalt .    She had MRI of the brain in June 2019 at [facility] (found in Care Everywhere).  No acute intracranial abnormality, minimal ethmoid sinus disease  Update April 20, 2020 SS: At last visit, was placed on Effexor  for headache, reported feeling emotional, jaw pain, itching, was stopped.  Switch to Topamax  working up to 75 mg at bedtime, but never started.  Currently reporting 4-5 migraines a week, works in dietary at [employer], foods and smells (spices) are triggers, has not been eating a good diet to avoid migraines.  Taking 1/2-1 tablet Maxalt  4-5 times a week, or BC powders.  Headaches are bilateral temples, throbbing, photophobia, phonophobia, nausea.  Has not had severe headache associated with vomiting as before.   Update March 14, 2022 SS: Missed last appointment, went to [place] for 3 months, her dad passed away from prostate cancer. 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 Increase in migraines, 2-3 a week. Takes Maxalt , it helps but she vomits it up, feels drowsy. Zofran  sometimes doesn't help. No longer on Topamax , it caused GI upset.   Update December 25, 2022 SS: Didn't get the Ajovy , claims her insurance wouldn't pay, I don't see any messages. Works in surveyor, mining. Would like to get FMLA, but not at 1 year yet. 12 migraines a month. Takes Imitrex  with good benefit if she doesn't vomit, has zofran  PRN.   Update July 09, 2023 SS: Migraines are better. Ajovy  was expensive, Emgality  is working well, about 1 headache a month, takes 1/2 Imitrex  100 mg once a month. Works firefighter. Does report after Emgality  some redness to injection site.  Resolves after a few days.  Update 07/09/24 SS: Migraines are doing great. Over the last year, generalized itchy rash diffusely on body. Went to dermatology, give cream, pills, possibly related to Emgality ? She is reluctant to change Emgality  since migraines so well controlled, worries about pills, with  migraines she vomits. Previously on Ajovy  with excellent control.   REVIEW OF SYSTEMS: Out of a complete 14 system review of symptoms, the patient complains only of the following symptoms, and all other reviewed systems are negative.  Headache   ALLERGIES: Allergies  Allergen Reactions   Relpax  [Eletriptan Hydrobromide]    Topamax  [Topiramate ]     Higher dose felt 50mg  ( reddened skin, anxiety, itch   Naproxen Dermatitis and Other (See Comments)    rash    HOME MEDICATIONS: Outpatient Medications Prior to Visit  Medication Sig Dispense Refill   cetirizine  (ZYRTEC  ALLERGY) 10 MG tablet Take 1 tablet (10 mg total) by mouth daily. 30 tablet 0   estradiol (VIVELLE-DOT) 0.1 MG/24HR patch 1 patch 2 (two) times a week.     Galcanezumab -gnlm (EMGALITY ) 120 MG/ML SOAJ Inject 120 mg into the skin every 30 (thirty) days. 1.12 mL 11   ondansetron  (ZOFRAN -ODT) 4 MG disintegrating tablet Take 1 tablet (4 mg total) by mouth every 8 (eight) hours as needed for nausea or vomiting. 20 tablet 3   pseudoephedrine  (SUDAFED) 30 MG tablet Take 1 tablet (30 mg total) by mouth every 8 (eight) hours as needed for congestion. 30 tablet 0   SUMAtriptan  (IMITREX ) 100 MG tablet Take 1 tablet (100 mg total) by mouth every 2 (two) hours as needed  for migraine. May repeat in 2 hours if headache persists or recurs. 10 tablet 11   Dupilumab 300 MG/2ML SOAJ Inject into the skin. (Patient not taking: Reported on 07/09/2024)     promethazine -dextromethorphan (PROMETHAZINE -DM) 6.25-15 MG/5ML syrup Take 5 mLs by mouth at bedtime as needed for cough. (Patient not taking: Reported on 07/09/2024) 100 mL 0   No facility-administered medications prior to visit.    PAST MEDICAL HISTORY: Past Medical History:  Diagnosis Date   HA (headache)     PAST SURGICAL HISTORY: Past Surgical History:  Procedure Laterality Date   LAPAROSCOPIC TOTAL HYSTERECTOMY  8/ 2016   TYMPANOPLASTY Left     FAMILY HISTORY: No family history on  file.  SOCIAL HISTORY: Social History   Socioeconomic History   Marital status: Married    Spouse name: Dallas   Number of children: 0   Years of education: college   Highest education level: Not on file  Occupational History    Comment: [employer]  Tobacco Use   Smoking status: Never   Smokeless tobacco: Never  Vaping Use   Vaping status: Never Used  Substance and Sexual Activity   Alcohol use: No   Drug use: No   Sexual activity: Yes    Birth control/protection: None  Other Topics Concern   Not on file  Social History Narrative   Patient lives at home with her husband.Cathey). Patient works for [employer] full time.   Education college.   Right handed.   Caffeine None.         Social Drivers of Health   Tobacco Use: Low Risk (05/15/2024)   Patient History    Smoking Tobacco Use: Never    Smokeless Tobacco Use: Never    Passive Exposure: Not on file  Financial Resource Strain: Not on file  Food Insecurity: Not on file  Transportation Needs: Not on file  Physical Activity: Not on file  Stress: Not on file  Social Connections: Unknown (10/22/2021)   Received from [facility]   Social Network    Social Network: Not on file  Intimate Partner Violence: Unknown (09/13/2021)   Received from [facility]   HITS    Physically Hurt: Not on file    Insult or Talk Down To: Not on file    Threaten Physical Harm: Not on file    Scream or Curse: Not on file  Depression (EYV7-0): Not on file  Alcohol Screen: Not on file  Housing: Not on file  Utilities: Not on file  Health Literacy: Not on file   PHYSICAL EXAM  Vitals:   07/09/24 1113  Weight: 117 lb 8 oz (53.3 kg)  Height: 4' 11 (1.499 m)   Body mass index is 23.73 kg/m.  Generalized: Well developed, in no acute distress  Skin: Faint generalized macular rash to right neck, forearms, ankles  Neurological examination  Mentation: Alert oriented to time, place, history taking. Follows all commands speech  and language fluent Cranial nerve II-XII: Pupils were equal round reactive to light. Extraocular movements were full, visual field were full on confrontational test. Facial sensation and strength were normal. Head turning and shoulder shrug  were normal and symmetric. Motor: The motor testing reveals 5 over 5 strength of all 4 extremities. Good symmetric motor tone is noted throughout.  Sensory: Sensory testing is intact to soft touch on all 4 extremities. No evidence of extinction is noted.  Coordination: Cerebellar testing reveals good finger-nose-finger and heel-to-shin bilaterally.  Gait and station: Gait  is normal.    DIAGNOSTIC DATA (LABS, IMAGING, TESTING) - I reviewed patient records, labs, notes, testing and imaging myself where available.  No results found for: WBC, HGB, HCT, MCV, PLT No results found for: NA, K, CL, CO2, GLUCOSE, BUN, CREATININE, CALCIUM, PROT, ALBUMIN, AST, ALT, ALKPHOS, BILITOT, GFRNONAA, GFRAA No results found for: CHOL, HDL, LDLCALC, LDLDIRECT, TRIG, CHOLHDL No results found for: YHAJ8R No results found for: VITAMINB12 No results found for: TSH  Lauraine Born, AGNP-C, DNP 07/09/2024, 11:17 AM [facility] [address], [address] [place], [place] [address] [phone] "

## 2025-02-11 ENCOUNTER — Ambulatory Visit: Admitting: Neurology
